# Patient Record
Sex: Female | Born: 1945
Health system: Southern US, Community
[De-identification: ages and names within clinical notes are randomized; demographics above are authoritative.]

## PROBLEM LIST (undated history)

## (undated) DIAGNOSIS — I10 Essential (primary) hypertension: Secondary | ICD-10-CM

## (undated) DIAGNOSIS — M1990 Unspecified osteoarthritis, unspecified site: Secondary | ICD-10-CM

## (undated) HISTORY — DX: Essential (primary) hypertension: I10

## (undated) HISTORY — DX: Unspecified osteoarthritis, unspecified site: M19.90

---

## 2011-03-25 HISTORY — PX: ABDOMINAL HYSTERECTOMY: SHX81

## 2012-03-24 HISTORY — PX: CATARACT EXTRACTION W/ INTRAOCULAR LENS  IMPLANT, BILATERAL: SHX1307

## 2012-03-24 HISTORY — PX: PARTIAL NEPHRECTOMY: SHX414

## 2015-10-04 ENCOUNTER — Other Ambulatory Visit: Payer: Self-pay | Admitting: Nurse Practitioner

## 2015-10-04 ENCOUNTER — Ambulatory Visit (INDEPENDENT_AMBULATORY_CARE_PROVIDER_SITE_OTHER): Payer: Medicare Other | Admitting: Nurse Practitioner

## 2015-10-04 ENCOUNTER — Encounter: Payer: Self-pay | Admitting: Nurse Practitioner

## 2015-10-04 ENCOUNTER — Telehealth: Payer: Self-pay

## 2015-10-04 VITALS — BP 144/88 | HR 59 | Temp 98.1°F | Ht 59.0 in | Wt 194.0 lb

## 2015-10-04 DIAGNOSIS — Z8542 Personal history of malignant neoplasm of other parts of uterus: Secondary | ICD-10-CM | POA: Diagnosis not present

## 2015-10-04 DIAGNOSIS — I10 Essential (primary) hypertension: Secondary | ICD-10-CM

## 2015-10-04 DIAGNOSIS — M170 Bilateral primary osteoarthritis of knee: Secondary | ICD-10-CM

## 2015-10-04 DIAGNOSIS — M810 Age-related osteoporosis without current pathological fracture: Secondary | ICD-10-CM | POA: Diagnosis not present

## 2015-10-04 LAB — CBC WITH DIFFERENTIAL/PLATELET
BASOS PCT: 0 %
Basophils Absolute: 0 cells/uL (ref 0–200)
EOS ABS: 174 {cells}/uL (ref 15–500)
Eosinophils Relative: 3 %
HEMATOCRIT: 37.7 % (ref 35.0–45.0)
Hemoglobin: 12.4 g/dL (ref 11.7–15.5)
LYMPHS PCT: 37 %
Lymphs Abs: 2146 cells/uL (ref 850–3900)
MCH: 30 pg (ref 27.0–33.0)
MCHC: 32.9 g/dL (ref 32.0–36.0)
MCV: 91.1 fL (ref 80.0–100.0)
MONO ABS: 522 {cells}/uL (ref 200–950)
MPV: 10.4 fL (ref 7.5–12.5)
Monocytes Relative: 9 %
NEUTROS ABS: 2958 {cells}/uL (ref 1500–7800)
Neutrophils Relative %: 51 %
Platelets: 243 10*3/uL (ref 140–400)
RBC: 4.14 MIL/uL (ref 3.80–5.10)
RDW: 13.7 % (ref 11.0–15.0)
WBC: 5.8 10*3/uL (ref 3.8–10.8)

## 2015-10-04 NOTE — Patient Instructions (Signed)
Will get blood work today Cont tylenol 500 mg 1-2 every 8 hours, max 3000 mg in 24 hours   Follow up for physical in 3 week

## 2015-10-04 NOTE — Telephone Encounter (Signed)
I called the patient's son to discuss the [employer] transportation form that he left for Shanda BumpsJessica to fill out. Shanda BumpsJessica said that she cannot complete this form until the patient has an MMSE. The patient is scheduled for a CPE on 10/29/15. The son agreed to wait until the 10/29/15 appointment to have the forms filled out.   The forms were placed in the filing cabinet in the folder for the 7th of the month. TL

## 2015-10-04 NOTE — Progress Notes (Signed)
Patient ID: Kerri Chan, female   DOB: [birth year], 70 y.o.   MRN: [id number]    PCP: [name], NP  Advanced Directive information Does patient have an advance directive?: No, Would patient like information on creating an advanced directive?: No - patient declined information  No Known Allergies  Chief Complaint  Patient presents with  . Medical Management of Chronic Issues    New Patient to get est with [facility], just moved here from [place]. Here with husband and son Kerri Chan      HPI: Patient is a 70 y.o. female seen in the office today to established care. Moved last year from [place] with son when he wsa relocated. Had visit with her PCP 1 year ago before they moved and has not seen someone since because they were busy getting settled in. Pt does not speak english, speaks hindi  Has not taken medication this morning- also fasting  OA of bilateral knees, has received cortisone injections in the past- has not had since 2015, taking OTC tylenol  Which helps her knee a little.  Stays in pain in her knees, attempts to ice and rest.  Also had back pain.  Hx of htn for over 15 years, son helping with diet  Was following oncology due to hx of uterine cancer in 2013 however in 2015 they completed follow up. Also found mass in kidney which was removed, not cancerous.   Review of Systems:  Review of Systems  Constitutional: Negative for activity change, appetite change, fatigue and unexpected weight change.  HENT: Negative for congestion and hearing loss.   Eyes: Negative.   Respiratory: Negative for cough and shortness of breath.   Cardiovascular: Negative for chest pain, palpitations and leg swelling.  Gastrointestinal: Negative for abdominal pain, diarrhea and constipation.  Genitourinary: Negative for dysuria and difficulty urinating.  Musculoskeletal: Positive for back pain and arthralgias. Negative for myalgias and joint swelling.  Skin: Negative for color change and wound.    Neurological: Negative for dizziness and weakness.  Psychiatric/Behavioral: Negative for behavioral problems, confusion and agitation.    Past Medical History  Diagnosis Date  . High blood pressure   . Arthritis    Past Surgical History  Procedure Laterality Date  . Abdominal hysterectomy  2013  . Cataract extraction w/ intraocular lens  implant, bilateral  2014  . Partial nephrectomy  2014    Right upper, mass   Social History:   reports that she has never smoked. She has never used smokeless tobacco. She reports that she does not drink alcohol or use illicit drugs.  History reviewed. No pertinent family history.  Medications: Patient's Medications  New Prescriptions   No medications on file  Previous Medications   ATENOLOL (TENORMIN) 25 MG TABLET    Take 25 mg by mouth daily. Take one tablet daily for blood pressure   HYDROCHLOROTHIAZIDE (MICROZIDE) 12.5 MG CAPSULE    Take 12.5 mg by mouth daily. Take one tablet daily for blood pressure   LOSARTAN (COZAAR) 100 MG TABLET    Take 100 mg by mouth daily. Take one tablet daily for blood pressure  Modified Medications   No medications on file  Discontinued Medications   No medications on file     Physical Exam:  Filed Vitals:   10/04/15 0915  BP: 144/88  Pulse: 59  Temp: 98.1 F (36.7 C)  TempSrc: Oral  Height:  (1.499 m)  Weight: 194 lb (87.998 kg)  SpO2: 94%   Body mass  index is 39.16 kg/(m^2).  Physical Exam  Constitutional: She is oriented to person, place, and time. She appears well-developed and well-nourished. No distress.  HENT:  Head: Normocephalic and atraumatic.  Eyes: Conjunctivae are normal. Pupils are equal, round, and reactive to light.  Neck: Normal range of motion. Neck supple.  Cardiovascular: Normal rate, regular rhythm and normal heart sounds.   Pulmonary/Chest: Effort normal and breath sounds normal.  Abdominal: Soft. Bowel sounds are normal.  Musculoskeletal: She exhibits  tenderness (to bilateral knees and lower back). She exhibits no edema.  Neurological: She is alert and oriented to person, place, and time.  Skin: Skin is warm and dry. She is not diaphoretic.  Psychiatric: She has a normal mood and affect.    Labs reviewed: Basic Metabolic Panel: No results for input(s): NA, K, CL, CO2, GLUCOSE, BUN, CREATININE, CALCIUM, MG, PHOS, TSH in the last 8760 hours. Liver Function Tests: No results for input(s): AST, ALT, ALKPHOS, BILITOT, PROT, ALBUMIN in the last 8760 hours. No results for input(s): LIPASE, AMYLASE in the last 8760 hours. No results for input(s): AMMONIA in the last 8760 hours. CBC: No results for input(s): WBC, NEUTROABS, HGB, HCT, MCV, PLT in the last 8760 hours. Lipid Panel: No results for input(s): CHOL, HDL, LDLCALC, TRIG, CHOLHDL, LDLDIRECT in the last 8760 hours. TSH: No results for input(s): TSH in the last 8760 hours. A1C: No results found for: HGBA1C   Assessment/Plan 1. Primary osteoarthritis of both knees Cont to use tylenol, ice and rest as needed -will set up for follow up with Dr Renato Gailseed in office for possible knee injections after physical.    2. Essential hypertension -well controlled on current regimen needs refills  - COMPLETE METABOLIC PANEL WITH GFR - Lipid panel - CBC with Differential/Platelets  3. Osteoporosis dexa scan early 2015 -not currently on supplements, encouraged calcium and vit D  -son to request these records to confirm.   4. History of uterine cancer Son will request release of records -will get referral to oncology for ongoing follow up.   Follow up in 3 weeks for EV with MMSE   Sharrell Krawiec K. Biagio BorgEubanks, AGNP  [facility] [phone](Monday-Friday 8 am - 5 pm) [phone] (after hours)

## 2015-10-05 LAB — COMPLETE METABOLIC PANEL WITH GFR
ALT: 10 U/L (ref 6–29)
AST: 15 U/L (ref 10–35)
Albumin: 4 g/dL (ref 3.6–5.1)
Alkaline Phosphatase: 49 U/L (ref 33–130)
BUN: 16 mg/dL (ref 7–25)
CHLORIDE: 103 mmol/L (ref 98–110)
CO2: 30 mmol/L (ref 20–31)
CREATININE: 0.82 mg/dL (ref 0.60–0.93)
Calcium: 9.4 mg/dL (ref 8.6–10.4)
GFR, EST AFRICAN AMERICAN: 84 mL/min (ref >=60)
GFR, Est Non African American: 73 mL/min (ref >=60)
GLUCOSE: 108 mg/dL — AB (ref 65–99)
Potassium: 3.9 mmol/L (ref 3.5–5.3)
SODIUM: 142 mmol/L (ref 135–146)
Total Bilirubin: 0.7 mg/dL (ref 0.2–1.2)
Total Protein: 6.8 g/dL (ref 6.1–8.1)

## 2015-10-08 LAB — LIPID PANEL
CHOL/HDL RATIO: 3 ratio (ref <=5.0)
Cholesterol: 200 mg/dL (ref 125–200)
HDL: 66 mg/dL (ref >=46)
LDL Cholesterol: 124 mg/dL (ref <130)
Triglycerides: 51 mg/dL (ref <150)
VLDL: 10 mg/dL (ref <30)

## 2015-10-22 ENCOUNTER — Other Ambulatory Visit: Payer: Self-pay | Admitting: *Deleted

## 2015-10-22 MED ORDER — HYDROCHLOROTHIAZIDE 12.5 MG PO CAPS: 12.5000 mg | ORAL_CAPSULE | Freq: Every day | ORAL | 1 refills | Status: DC

## 2015-10-22 MED ORDER — ATENOLOL 25 MG PO TABS: 25.0000 mg | ORAL_TABLET | Freq: Every day | ORAL | 1 refills | Status: DC

## 2015-10-22 MED ORDER — LOSARTAN POTASSIUM 100 MG PO TABS: 100.0000 mg | ORAL_TABLET | Freq: Every day | ORAL | 1 refills | Status: DC

## 2015-10-22 NOTE — Telephone Encounter (Signed)
Patient son requested. Faxed to pharmacy.  

## 2015-10-29 ENCOUNTER — Encounter: Payer: Self-pay | Admitting: Nurse Practitioner

## 2015-10-29 ENCOUNTER — Ambulatory Visit (INDEPENDENT_AMBULATORY_CARE_PROVIDER_SITE_OTHER): Payer: Medicare Other | Admitting: Nurse Practitioner

## 2015-10-29 VITALS — BP 128/84 | HR 65 | Temp 98.2°F | Resp 19 | Ht 59.0 in | Wt 196.0 lb

## 2015-10-29 DIAGNOSIS — E2839 Other primary ovarian failure: Secondary | ICD-10-CM | POA: Diagnosis not present

## 2015-10-29 DIAGNOSIS — Z0000 Encounter for general adult medical examination without abnormal findings: Secondary | ICD-10-CM

## 2015-10-29 DIAGNOSIS — Z1231 Encounter for screening mammogram for malignant neoplasm of breast: Secondary | ICD-10-CM | POA: Diagnosis not present

## 2015-10-29 DIAGNOSIS — Z1211 Encounter for screening for malignant neoplasm of colon: Secondary | ICD-10-CM | POA: Diagnosis not present

## 2015-10-29 DIAGNOSIS — M170 Bilateral primary osteoarthritis of knee: Secondary | ICD-10-CM | POA: Diagnosis not present

## 2015-10-29 DIAGNOSIS — I10 Essential (primary) hypertension: Secondary | ICD-10-CM

## 2015-10-29 DIAGNOSIS — R519 Headache, unspecified: Secondary | ICD-10-CM

## 2015-10-29 DIAGNOSIS — R51 Headache: Secondary | ICD-10-CM

## 2015-10-29 NOTE — Progress Notes (Signed)
Patient Care Team: Sharon SellerJessica K Keiyon Plack, NP as PCP - General (Geriatric Medicine)  Extended Emergency Contact Information Primary Emergency Contact: Kerri PeachAhmed,[name]  [place] Home Phone: [phone] Relation: Son No Known Allergies Code Status: FULL  Goals of Care: Advanced Directive information Advanced Directives 10/29/2015  Does patient have an advance directive? -  Would patient like information on creating an advanced directive? No - patient declined information     Chief Complaint  Patient presents with  . Medical Management of Chronic Issues    Complete physical. Compete [place] forms & Disability forms.   Kerri Chan. Other    Son, Kerri Chan, in room     HPI: Patient is a 70 y.o. female seen in today for an annual wellness exam.    Depression screen [facility] 2/9 10/29/2015 10/04/2015  Decreased Interest 0 0  Down, Depressed, Hopeless 0 0  PHQ - 2 Score 0 0    Fall Risk  10/29/2015 10/04/2015  Falls in the past year? No No   MMSE - Mini Mental State Exam 10/29/2015  Orientation to time 4  Orientation to Place 3  Registration 3  Attention/ Calculation 5  Recall 3  Language- name 2 objects 2  Language- repeat 1  Language- follow 3 step command 3  Language- read & follow direction 1  Write a sentence 1  Copy design 1  Total score 27     Health Maintenance  Topic Date Due  . Hepatitis C Screening  05/31/1945  . TETANUS/TDAP  03/24/1964  . MAMMOGRAM  03/25/1995  . ZOSTAVAX  03/24/2005  . DEXA SCAN  03/24/2010  . PNA vac Low Risk Adult (2 of 2 - PCV13) 12/23/2014  . INFLUENZA VACCINE  10/23/2015  . COLONOSCOPY  11/20/2023 (Originally 03/25/1995)  need records from previous provider- they have been requested Unsure of exact vaccine history  Urinary incontinence? none Functional Status Survey: Is the patient deaf or have difficulty hearing?: No Does the patient have difficulty seeing, even when wearing glasses/contacts?: No Does the patient have difficulty  concentrating, remembering, or making decisions?: No Does the patient have difficulty walking or climbing stairs?: Yes (knee pain and has support with walker) Does the patient have difficulty dressing or bathing?: No Does the patient have difficulty doing errands alone such as visiting a doctor's office or shopping?: Yes (language barrier) Exercise? Unable due to knee pain Diet? Heart healthy diet  No exam data present Dentition: does not go to the dentist routinely, fair condition  Pain: knee pain, tylenol is not effective.  Has had headaches for the last 3 weeks, sometimes on right some times on left- central headache Can happen multiple times a day, comes and goes, 3-4/10 No hx of headaches, does not effect vision, no dizziness  Light or sounds do no effect it.  No increase in stress in the last few weeks No changes in vision   Past Medical History:  Diagnosis Date  . Arthritis   . High blood pressure     Past Surgical History:  Procedure Laterality Date  . ABDOMINAL HYSTERECTOMY  2013   due to cancer in uterus   . CATARACT EXTRACTION W/ INTRAOCULAR LENS  IMPLANT, BILATERAL  2014  . PARTIAL NEPHRECTOMY  2014   Right upper, mass    Social History   Social History  . Marital status: Married    Spouse name: N/A  . Number of children: N/A  . Years of education: N/A   Social History Main Topics  .  Smoking status: Never Smoker  . Smokeless tobacco: Never Used  . Alcohol use No  . Drug use: No  . Sexual activity: Not Asked   Other Topics Concern  . None   Social History Narrative   Diet? Healthy      Do you drink/eat things with caffeine? Black and Green Tea      Marital status?                   m                 What year were you married? 1968      Do you live in a house, apartment, assisted living, condo, trailer, etc.? apartment      Is it one or more stories? 2 story      How many persons live in your home? 3 (husband and son)      Do you have any  pets in your home? (please list) 1- a parrot (small bird)      Current or past profession: -      Do you exercise?      walk                                Type & how often? light      Do you have a living will? no      Do you have a DNR form?      no                            If not, do you want to discuss one? N/A      Do you have signed POA/HPOA for forms? N/A          Family History  Problem Relation Age of Onset  . Arthritis Mother   . Arthritis Father     Review of Systems:  Review of Systems  Constitutional: Negative for activity change, appetite change, fatigue and unexpected weight change.  HENT: Negative for congestion and hearing loss.   Eyes: Negative.   Respiratory: Negative for cough and shortness of breath.   Cardiovascular: Negative for chest pain, palpitations and leg swelling.  Gastrointestinal: Negative for abdominal pain, constipation and diarrhea.  Genitourinary: Negative for difficulty urinating and dysuria.  Musculoskeletal: Positive for arthralgias and back pain. Negative for joint swelling and myalgias.  Skin: Negative for color change and wound.  Neurological: Positive for headaches. Negative for dizziness and weakness.  Psychiatric/Behavioral: Negative for agitation, behavioral problems and confusion.       Medication List       Accurate as of 10/29/15 11:23 AM. Always use your most recent med list.          atenolol 25 MG tablet Commonly known as:  TENORMIN Take 1 tablet (25 mg total) by mouth daily. for blood pressure   hydrochlorothiazide 12.5 MG capsule Commonly known as:  MICROZIDE Take 1 capsule (12.5 mg total) by mouth daily. for blood pressure   losartan 100 MG tablet Commonly known as:  COZAAR Take 1 tablet (100 mg total) by mouth daily. for blood pressure         Physical Exam: Vitals:   10/29/15 1104  BP: 128/84  Pulse: 65  Resp: 19  Temp: 98.2 F (36.8 C)  TempSrc: Oral  SpO2: 92%  Weight: 196 lb (88.9 kg)  Height: 4\' 11"  (1.499 m)   Body mass index is 39.59 kg/m. Physical Exam  Constitutional: She is oriented to person, place, and time. She appears well-developed and well-nourished. No distress.  HENT:  Head: Normocephalic and atraumatic.  Right Ear: External ear normal.  Left Ear: External ear normal.  Nose: Nose normal.  Mouth/Throat: Oropharynx is clear and moist. No oropharyngeal exudate.  Eyes: Conjunctivae are normal. Pupils are equal, round, and reactive to light.  Neck: Normal range of motion. Neck supple.  Cardiovascular: Normal rate, regular rhythm, normal heart sounds and intact distal pulses.   Pulmonary/Chest: Effort normal and breath sounds normal.  Declines breast exam today  Abdominal: Soft. Bowel sounds are normal. She exhibits no distension. There is no tenderness.  Obese abdomen   Musculoskeletal: She exhibits tenderness (to bilateral knees and lower back). She exhibits no edema.  Neurological: She is alert and oriented to person, place, and time. She has normal reflexes.  Skin: Skin is warm and dry. She is not diaphoretic.  Psychiatric: She has a normal mood and affect.    Labs reviewed: Basic Metabolic Panel:  Recent Labs  29/56/21 0952  NA 142  K 3.9  CL 103  CO2 30  GLUCOSE 108*  BUN 16  CREATININE 0.82  CALCIUM 9.4   Liver Function Tests:  Recent Labs  10/04/15 0952  AST 15  ALT 10  ALKPHOS 49  BILITOT 0.7  PROT 6.8  ALBUMIN 4.0   No results for input(s): LIPASE, AMYLASE in the last 8760 hours. No results for input(s): AMMONIA in the last 8760 hours. CBC:  Recent Labs  10/04/15 0952  WBC 5.8  NEUTROABS 2,958  HGB 12.4  HCT 37.7  MCV 91.1  PLT 243   Lipid Panel:  Recent Labs  10/04/15 0952  CHOL 200  HDL 66  LDLCALC 124  TRIG 51  CHOLHDL 3.0   No results found for: HGBA1C  Procedures: No results found.  Assessment/Plan 1. Special screening for malignant neoplasms, colon Agreeable to cologuard  2. Encounter  for screening mammogram for breast cancer -declines breast exam today but agreeable to mammogram  - MM DIGITAL SCREENING BILATERAL; Future  3. Essential hypertension Blood pressure stable on current regimen, cont current medication and lifestyle modifications  - EKG 12-Lead  4. Estrogen deficiency - screening DG Bone Density; Future  5. Primary osteoarthritis of both knees - DG Knee Complete 4 Views Left; Future - DG Knee Complete 4 Views Right; Future -to cont tylenol as needed, will have pt follow up with Dr Renato Gails for injections once xrays complete   6. Headache, unspecified headache type -new onset headache, no neurological deficits on exam today - CT Head Wo Contrast for further evaluation   7.  Medicare annual wellness visit, subsequent MMSE 27/30, lives with son who helps take care of her and her husband Language barrier noted  Counseling regarding regular self-examination of the breasts on a monthly basis, prevention of dental and periodontal disease, diet, regular sustained exercise for at least 30 minutes 5 times per week, routine screening interval for mammogram as recommended by the American Cancer Society and ACOG, importance of regular PAP smears, smoking cessation, tobacco use,  and recommended schedule for GI hemoccult testing, colonoscopy, cholesterol, thyroid and diabetes screening. -hx of breast cancer, pt referred to oncologist for ongoing follow up  To follow up with Dr Renato Gails for knee injections Forms completed   Kerri Chan. Kerri Chan  [facility] [phone] 8 am - 5  pm) [phone] (after hours)

## 2015-10-29 NOTE — Patient Instructions (Addendum)
To get xrays of knees prior to appt with Dr Renato Gailseed  To get CT head due to headaches  Diet modifications- decrease sugars and simple carbohydrates Weight loss encouraged will help knee pain   Follow up with Dr Renato Gailseed for knee injections   Need to get mammogram and bone density scan

## 2015-11-02 ENCOUNTER — Telehealth: Payer: Self-pay | Admitting: *Deleted

## 2015-11-02 NOTE — Telephone Encounter (Signed)
Referring office called to obtain most recent records. Called LM at [facility] at [phone]  [facility]

## 2015-11-06 ENCOUNTER — Other Ambulatory Visit: Payer: Self-pay | Admitting: Nurse Practitioner

## 2015-11-06 DIAGNOSIS — Z1231 Encounter for screening mammogram for malignant neoplasm of breast: Secondary | ICD-10-CM

## 2015-11-07 ENCOUNTER — Ambulatory Visit
Admission: RE | Admit: 2015-11-07 | Discharge: 2015-11-07 | Disposition: A | Discharge status: Home / Self Care | Payer: Medicare Other | Source: Ambulatory Visit | Attending: Nurse Practitioner | Admitting: Nurse Practitioner

## 2015-11-07 DIAGNOSIS — R51 Headache: Secondary | ICD-10-CM | POA: Diagnosis not present

## 2015-11-07 DIAGNOSIS — M170 Bilateral primary osteoarthritis of knee: Secondary | ICD-10-CM

## 2015-11-07 DIAGNOSIS — R519 Headache, unspecified: Secondary | ICD-10-CM

## 2015-11-07 DIAGNOSIS — M179 Osteoarthritis of knee, unspecified: Secondary | ICD-10-CM | POA: Diagnosis not present

## 2015-11-09 ENCOUNTER — Encounter: Payer: Self-pay | Admitting: Nurse Practitioner

## 2015-11-15 ENCOUNTER — Ambulatory Visit
Admission: RE | Admit: 2015-11-15 | Discharge: 2015-11-15 | Disposition: A | Discharge status: Home / Self Care | Payer: Medicare Other | Source: Ambulatory Visit | Attending: Nurse Practitioner | Admitting: Nurse Practitioner

## 2015-11-15 DIAGNOSIS — Z780 Asymptomatic menopausal state: Secondary | ICD-10-CM | POA: Diagnosis not present

## 2015-11-15 DIAGNOSIS — Z1231 Encounter for screening mammogram for malignant neoplasm of breast: Secondary | ICD-10-CM

## 2015-11-15 DIAGNOSIS — M8588 Other specified disorders of bone density and structure, other site: Secondary | ICD-10-CM | POA: Diagnosis not present

## 2015-11-15 DIAGNOSIS — E2839 Other primary ovarian failure: Secondary | ICD-10-CM

## 2015-11-28 ENCOUNTER — Ambulatory Visit: Payer: Self-pay | Attending: Gynecologic Oncology | Admitting: Gynecologic Oncology

## 2015-12-03 ENCOUNTER — Ambulatory Visit: Payer: Medicare Other | Admitting: Nurse Practitioner

## 2015-12-10 ENCOUNTER — Ambulatory Visit (INDEPENDENT_AMBULATORY_CARE_PROVIDER_SITE_OTHER): Payer: Medicare Other | Admitting: Internal Medicine

## 2015-12-10 ENCOUNTER — Encounter: Payer: Self-pay | Admitting: Internal Medicine

## 2015-12-10 VITALS — BP 160/100 | HR 69 | Temp 98.1°F | Wt 199.0 lb

## 2015-12-10 DIAGNOSIS — I10 Essential (primary) hypertension: Secondary | ICD-10-CM | POA: Diagnosis not present

## 2015-12-10 DIAGNOSIS — M170 Bilateral primary osteoarthritis of knee: Secondary | ICD-10-CM | POA: Diagnosis not present

## 2015-12-10 MED ORDER — METHYLPREDNISOLONE ACETATE 40 MG/ML IJ SUSP
40.0000 mg | Freq: Once | INTRAMUSCULAR | Status: AC
  Administered 2015-12-10: 40 mg via INTRAMUSCULAR

## 2015-12-10 NOTE — Addendum Note (Signed)
Addended by: Sueanne MargaritaSMITH, DESHANNON L on: 12/10/2015 10:58 AM   Modules accepted: Orders

## 2015-12-10 NOTE — Progress Notes (Signed)
[facility] Provider:  Dillan Lunden L. Renato Gailseed, D.O., C.M.D.  Code Status: full code  Goals of Care:  Advanced Directives 12/10/2015  Does patient have an advance directive? No  Would patient like information on creating an advanced directive? No - patient declined information    Chief Complaint  Patient presents with  . Follow-up    follow-up after knee xray, possible knee injection    HPI: Patient is a 70 y.o. female seen today for knee pain and need for bilateral steroid injections.  Conservative therapies with otc medications have not been successful at providing relief.  She ambulates with a walker.  Xrays of her right knee showed Moderate degenerative changes, especially medially with joint space loss and spurring, also intra-articular loose body in suprapatellar bursa.    Xrays of left knee showed moderate degenerative changes also, joint space narrowing and spurring more in the medial and patellofemoral compartments.    Consent obtained for knee injections.  Patient is agreeable and has actually had steroid injections in [place] before.  The interpreter assisted with preparing her for the injections and reviewing the results of the xrays.  Pt takes bp meds at 10 am so had not had them at the time of her appt.  BP was very high b/c of that and severe knee pain where she was tender to touch on both knees.  Past Medical History:  Diagnosis Date  . Arthritis   . High blood pressure     Past Surgical History:  Procedure Laterality Date  . ABDOMINAL HYSTERECTOMY  2013   due to cancer in uterus   . CATARACT EXTRACTION W/ INTRAOCULAR LENS  IMPLANT, BILATERAL  2014  . PARTIAL NEPHRECTOMY  2014   Right upper, mass    No Known Allergies    Medication List       Accurate as of 12/10/15  9:40 AM. Always use your most recent med list.          atenolol 25 MG tablet Commonly known as:  TENORMIN Take 1 tablet (25 mg total) by mouth daily. for blood pressure     CALTRATE 600+D 600-400 MG-UNIT tablet Generic drug:  Calcium Carbonate-Vitamin D Take 1 tablet by mouth 2 (two) times daily.   hydrochlorothiazide 12.5 MG capsule Commonly known as:  MICROZIDE Take 1 capsule (12.5 mg total) by mouth daily. for blood pressure   losartan 100 MG tablet Commonly known as:  COZAAR Take 1 tablet (100 mg total) by mouth daily. for blood pressure       Review of Systems:  Review of Systems  Constitutional: Negative for chills and fever.  Cardiovascular:       Hypertension  Musculoskeletal: Positive for joint pain.       Gait problem, uses walker    Health Maintenance  Topic Date Due  . Hepatitis C Screening  [birth year]  . TETANUS/TDAP  03/24/1964  . ZOSTAVAX  03/24/2005  . PNA vac Low Risk Adult (2 of 2 - PCV13) 12/23/2014  . INFLUENZA VACCINE  10/23/2015  . COLONOSCOPY  11/20/2023 (Originally 03/25/1995)  . MAMMOGRAM  11/14/2017  . DEXA SCAN  Completed    Physical Exam: Vitals:   12/10/15 0921  BP: (!) 160/100  Pulse: 69  Temp: 98.1 F (36.7 C)  TempSrc: Oral  SpO2: 96%  Weight: 199 lb (90.3 kg)   Body mass index is 40.19 kg/m. Physical Exam  Constitutional: She appears well-developed and well-nourished. No distress.  Musculoskeletal: She exhibits tenderness.  Small effusions medially bilateral, quite tender to touch bilaterally  Neurological: She is alert.  Skin: Skin is warm and dry.    Labs reviewed: Basic Metabolic Panel:  Recent Labs  16/10/96 0952  NA 142  K 3.9  CL 103  CO2 30  GLUCOSE 108*  BUN 16  CREATININE 0.82  CALCIUM 9.4   Liver Function Tests:  Recent Labs  10/04/15 0952  AST 15  ALT 10  ALKPHOS 49  BILITOT 0.7  PROT 6.8  ALBUMIN 4.0   No results for input(s): LIPASE, AMYLASE in the last 8760 hours. No results for input(s): AMMONIA in the last 8760 hours. CBC:  Recent Labs  10/04/15 0952  WBC 5.8  NEUTROABS 2,958  HGB 12.4  HCT 37.7  MCV 91.1  PLT 243   Lipid Panel:  Recent  Labs  10/04/15 0952  CHOL 200  HDL 66  LDLCALC 124  TRIG 51  CHOLHDL 3.0   No results found for: HGBA1C  Procedures since last visit: Dg Bone Density  Result Date: 11/15/2015 EXAM: DUAL X-RAY ABSORPTIOMETRY (DXA) FOR BONE MINERAL DENSITY IMPRESSION: Referring Physician:  [name] PATIENT: Name: Kerri Chan, Kerri Chan Patient ID: [id number] Birth Date: [birth year] Height: 59.0 in. Sex: Female Measured: 11/15/2015 Weight: 196.0 lbs. Indications: Advanced Age, Bilateral Ovariectomy (65.51), Estrogen Deficient, Hysterectomy, Postmenopausal, Rheumatoid Arthritis (714.0) Fractures: None Treatments: Calcium (E943.0) ASSESSMENT: The BMD measured at AP Spine L1-L4 is 1.022 g/cm2 with a T-score of -1.4. This patient is considered osteopenic according to World Health Organization [facility]) criteria. Site Region Measured Date Measured Age YA BMD Significant CHANGE T-score AP Spine  L1-L4      11/15/2015    70.6         -1.4    1.022 g/cm2 DualFemur Neck Right 11/15/2015    70.6         -0.8    0.928 g/cm2 World Health Organization [facility]) criteria for post-menopausal, Caucasian Women: Normal       T-score at or above -1 SD Osteopenia   T-score between -1 and -2.5 SD Osteoporosis T-score at or below -2.5 SD RECOMMENDATION: National Osteoporosis Foundation recommends that FDA-approved medical therapies be considered in postmenopausal women and men age 62 or older with a: 1. Hip or vertebral (clinical or morphometric) fracture. 2. T-score of <-2.5 at the spine or hip. 3. Ten-year fracture probability by FRAX of 3% or greater for hip fracture or 20% or greater for major osteoporotic fracture. All treatment decisions require clinical judgment and consideration of individual patient factors, including patient preferences, co-morbidities, previous drug use, risk factors not captured in the FRAX model (e.g. falls, vitamin D deficiency, increased bone turnover, interval significant decline in bone density) and possible under - or  over-estimation of fracture risk by FRAX. All patients should ensure an adequate intake of dietary calcium (1200 mg/d) and vitamin D (800 IU daily) unless contraindicated. FOLLOW-UP: People with diagnosed cases of osteoporosis or at high risk for fracture should have regular bone mineral density tests. For patients eligible for Medicare, routine testing is allowed once every 2 years. The testing frequency can be increased to one year for patients who have rapidly progressing disease, those who are receiving or discontinuing medical therapy to restore bone mass, or have additional risk factors. FRAX* 10-year Probability of Fracture Based on femoral neck BMD: DualFemur (Right) Major Osteoporotic Fracture: 9.7% Hip Fracture:                0.9% Population:  [place] (Caucasian) Risk Factors:                Rheumatoid Arthritis (714.0) *FRAX is a Armed forces logistics/support/administrative officer of the [employer] of [employer] for Metabolic Bone Disease, a World Science writer (WHO) [employer]. ASSESSMENT: The probability of a major osteoporotic fracture is 9.7 % within the next ten years. The probability of a hip fracture is 0.9 % within the next ten years. Electronically Signed   By: Myles Rosenthal M.D.   On: 11/15/2015 17:11   Mm Digital Screening Bilateral  Result Date: 12/06/2015 CLINICAL DATA:  Screening. EXAM: DIGITAL SCREENING BILATERAL MAMMOGRAM WITH CAD COMPARISON:  None. ACR Breast Density Category b: There are scattered areas of fibroglandular density. FINDINGS: There are no findings suspicious for malignancy. Images were processed with CAD. IMPRESSION: No mammographic evidence of malignancy. A result letter of this screening mammogram will be mailed directly to the patient. RECOMMENDATION: Screening mammogram in one year. (Code:SM-B-01Y) BI-RADS CATEGORY  1: Negative. Electronically Signed   By: Britta Mccreedy M.D.   On: 12/06/2015 16:15    Assessment/Plan 1. Primary osteoarthritis of both  knees -was not responding to otc pain medications and not tolerant of stronger meds -did well with injections in the past in [place] -consent obtained, bilateral knees cleansed with betadine, sprayed with topical anesthetic and via medial approach, 2cc of 1% lidocaine and 1cc of 40mg  depomedrol injected into each knee  2. Benign Essential htn -bp elevated this am but pt had not had her medicatons for the morning (takes at 10am regularly) -cont hctz, cozaar and atenolol and monitor  Next appt:  3 mos for med mgt with Abbey Chatters, NP  Daysia Vandenboom L. Michol Emory, D.O. Geriatrics [facility] [address], [place] [address] Cell Phone (Mon-Fri 8am-5pm):  [phone] On Call:  [phone] & follow prompts after 5pm & weekends Office Phone:  [phone] Office Fax:  [phone]

## 2015-12-11 ENCOUNTER — Telehealth: Payer: Self-pay | Admitting: *Deleted

## 2015-12-11 NOTE — Telephone Encounter (Signed)
Patient son called and stated that his mother received a Knee injection yesterday and last night and today he feels like her blood pressure is running high. He stated that her face is red and puffy. Seems to be doing better now than at lunch time. He wanted to make you aware. He stated that he will monitor her and call us with report.

## 2015-12-12 NOTE — Telephone Encounter (Signed)
LMOM to return call.

## 2015-12-12 NOTE — Telephone Encounter (Signed)
It is possible that the steroid medication could make her blood pressure go up and it can cause some swelling as a general side effect.  If her blood pressures are remaining up over 150/90 for 3+ days, she should come back in to see Kerri Chan or myself.

## 2016-03-10 ENCOUNTER — Encounter: Payer: Self-pay | Admitting: Nurse Practitioner

## 2016-03-10 ENCOUNTER — Ambulatory Visit (INDEPENDENT_AMBULATORY_CARE_PROVIDER_SITE_OTHER): Payer: Medicare Other | Admitting: Nurse Practitioner

## 2016-03-10 VITALS — BP 154/96 | HR 63 | Temp 97.8°F | Resp 17 | Ht 59.0 in | Wt 199.6 lb

## 2016-03-10 DIAGNOSIS — I10 Essential (primary) hypertension: Secondary | ICD-10-CM | POA: Diagnosis not present

## 2016-03-10 DIAGNOSIS — Z23 Encounter for immunization: Secondary | ICD-10-CM | POA: Diagnosis not present

## 2016-03-10 DIAGNOSIS — M170 Bilateral primary osteoarthritis of knee: Secondary | ICD-10-CM | POA: Diagnosis not present

## 2016-03-10 DIAGNOSIS — M8580 Other specified disorders of bone density and structure, unspecified site: Secondary | ICD-10-CM | POA: Diagnosis not present

## 2016-03-10 DIAGNOSIS — K219 Gastro-esophageal reflux disease without esophagitis: Secondary | ICD-10-CM

## 2016-03-10 NOTE — Progress Notes (Signed)
Careteam: Patient Care Team: Lauree Chandler, NP as PCP - General (Geriatric Medicine)  Advanced Directive information Does Patient Have a Medical Advance Directive?: No, Would patient like information on creating a medical advance directive?: Yes (MAU/Ambulatory/Procedural Areas - Information given) (Paperwork was given to son to complete. )  No Known Allergies  Chief Complaint  Patient presents with  . Medical Management of Chronic Issues    3 month follow up. Pt reports pain/swelling both knees.      HPI: Patient is a 70 y.o. female seen in the office today for routine follow up.  Reports pain in her knees. Had injection 3 months ago and reports these helped a lot but have worn off.  Pain is currently manageable at this time. However does report pain to knees, shin and thigh. Pain worse on right vs left Using pedal that has a cycling aspect to it for exercise. Walks with son at store.  Blood pressure was high after injection but after 24 hours returned to normal.  Blood pressure at home 115-120/80. Son checks frequently.  Eating well. No constipation  occasional GERD but uses TUMs with good effects  Review of Systems:  Review of Systems  Constitutional: Negative for activity change, appetite change, fatigue and unexpected weight change.  HENT: Negative for congestion and hearing loss.   Eyes: Negative.   Respiratory: Negative for cough and shortness of breath.   Cardiovascular: Negative for chest pain, palpitations and leg swelling.  Gastrointestinal: Negative for abdominal pain, constipation and diarrhea.  Genitourinary: Negative for difficulty urinating and dysuria.  Musculoskeletal: Positive for arthralgias (in knees) and back pain. Negative for joint swelling and myalgias.  Skin: Negative for color change and wound.  Neurological: Negative for dizziness and weakness.  Psychiatric/Behavioral: Negative for agitation, behavioral problems and confusion.    Past  Medical History:  Diagnosis Date  . Arthritis   . High blood pressure    Past Surgical History:  Procedure Laterality Date  . ABDOMINAL HYSTERECTOMY  2013   due to cancer in uterus   . CATARACT EXTRACTION W/ INTRAOCULAR LENS  IMPLANT, BILATERAL  2014  . PARTIAL NEPHRECTOMY  2014   Right upper, mass   Social History:   reports that she has never smoked. She has never used smokeless tobacco. She reports that she does not drink alcohol or use drugs.  Family History  Problem Relation Age of Onset  . Arthritis Mother   . Arthritis Father     Medications: Patient's Medications  New Prescriptions   No medications on file  Previous Medications   ATENOLOL (TENORMIN) 25 MG TABLET    Take 1 tablet (25 mg total) by mouth daily. for blood pressure   CALCIUM CARBONATE-VITAMIN D (CALTRATE 600+D) 600-400 MG-UNIT TABLET    Take 1 tablet by mouth 2 (two) times daily.   HYDROCHLOROTHIAZIDE (MICROZIDE) 12.5 MG CAPSULE    Take 1 capsule (12.5 mg total) by mouth daily. for blood pressure   LOSARTAN (COZAAR) 100 MG TABLET    Take 1 tablet (100 mg total) by mouth daily. for blood pressure  Modified Medications   No medications on file  Discontinued Medications   No medications on file     Physical Exam:  Vitals:   03/10/16 1302  BP: (!) 154/96  Pulse: 63  Resp: 17  Temp: 97.8 F (36.6 C)  TempSrc: Oral  SpO2: 95%  Weight: 199 lb 9.6 oz (90.5 kg)  Height: _0  (1.499 m)   Body  mass index is 40.31 kg/m.  Physical Exam  Constitutional: She is oriented to person, place, and time. She appears well-developed and well-nourished. No distress.  HENT:  Head: Normocephalic and atraumatic.  Right Ear: External ear normal.  Left Ear: External ear normal.  Nose: Nose normal.  Mouth/Throat: Oropharynx is clear and moist. No oropharyngeal exudate.  Eyes: Conjunctivae are normal. Pupils are equal, round, and reactive to light.  Neck: Normal range of motion. Neck supple.  Cardiovascular:  Normal rate, regular rhythm, normal heart sounds and intact distal pulses.   Pulmonary/Chest: Effort normal and breath sounds normal.  Abdominal: Soft. Bowel sounds are normal. She exhibits no distension. There is no tenderness.  Obese abdomen   Musculoskeletal: She exhibits tenderness (to bilateral knees and lower back). She exhibits no edema.  Neurological: She is alert and oriented to person, place, and time. She has normal reflexes.  Skin: Skin is warm and dry. She is not diaphoretic.  Psychiatric: She has a normal mood and affect.    Labs reviewed: Basic Metabolic Panel:  Recent Labs  10/04/15 0952  NA 142  K 3.9  CL 103  CO2 30  GLUCOSE 108*  BUN 16  CREATININE 0.82  CALCIUM 9.4   Liver Function Tests:  Recent Labs  10/04/15 0952  AST 15  ALT 10  ALKPHOS 49  BILITOT 0.7  PROT 6.8  ALBUMIN 4.0   No results for input(s): LIPASE, AMYLASE in the last 8760 hours. No results for input(s): AMMONIA in the last 8760 hours. CBC:  Recent Labs  10/04/15 0952  WBC 5.8  NEUTROABS 2,958  HGB 12.4  HCT 37.7  MCV 91.1  PLT 243   Lipid Panel:  Recent Labs  10/04/15 0952  CHOL 200  HDL 66  LDLCALC 124  TRIG 51  CHOLHDL 3.0   TSH: No results for input(s): TSH in the last 8760 hours. A1C: No results found for: HGBA1C   Assessment/Plan  1. Primary osteoarthritis of both knees Improved after injection. May use tylenol and muscle rubs as needed -may make appt with Dr Mariea Clonts for another injection after 4 months if needed  2. Essential hypertension -elevated today but well controlled at home. May be related to stress of OV/nerveness  -to cont current regimen - BMP with eGFR  3. Osteopenia, unspecified location -cont weight bearing exercises and Calcium and Vit D supplement  4. Gastroesophageal reflux disease without esophagitis Occasional GERD, cont diet modifications and TUMS as needed  Riley Hallum K. Harle Battiest  [facility] [phone] 8 am - 5 pm) [phone] (after hours)

## 2016-03-10 NOTE — Patient Instructions (Signed)
To use tylenol 325 1-2 tablets every 6 hours as needed for pain.  May use muscle rubs as needed (biofreeze, bengay, icyhot)  You can have your knees injected every 4 months with Dr Renato Gailseed If needed for knee pain-- call and make appt with her as needed for this.

## 2016-03-11 LAB — BASIC METABOLIC PANEL WITH GFR
BUN: 15 mg/dL (ref 7–25)
CHLORIDE: 98 mmol/L (ref 98–110)
CO2: 29 mmol/L (ref 20–31)
Calcium: 9.8 mg/dL (ref 8.6–10.4)
Creat: 0.79 mg/dL (ref 0.60–0.93)
GFR, EST AFRICAN AMERICAN: 88 mL/min (ref >=60)
GFR, EST NON AFRICAN AMERICAN: 76 mL/min (ref >=60)
Glucose, Bld: 124 mg/dL — ABNORMAL HIGH (ref 65–99)
POTASSIUM: 3.8 mmol/L (ref 3.5–5.3)
Sodium: 137 mmol/L (ref 135–146)

## 2016-04-10 ENCOUNTER — Other Ambulatory Visit: Payer: Self-pay | Admitting: Nurse Practitioner

## 2016-09-08 ENCOUNTER — Ambulatory Visit: Payer: Medicare Other | Admitting: Nurse Practitioner

## 2016-10-04 ENCOUNTER — Other Ambulatory Visit: Payer: Self-pay | Admitting: Nurse Practitioner

## 2016-10-05 ENCOUNTER — Other Ambulatory Visit: Payer: Self-pay | Admitting: Nurse Practitioner

## 2016-10-30 ENCOUNTER — Telehealth: Payer: Self-pay

## 2016-10-30 NOTE — Telephone Encounter (Signed)
Okay, noted

## 2016-10-30 NOTE — Telephone Encounter (Signed)
A fax was received from [facility] stating that patient's cologuard order has been cancelled due to order expired. A new order form must be submitted if test is clinically indicated.   I called patient to see if she was still interested in having cologuard done but there was no answer. I left a message asking that patient call the office to discuss desire to continue with cologuard.

## 2017-01-06 ENCOUNTER — Telehealth: Payer: Self-pay

## 2017-01-06 NOTE — Telephone Encounter (Signed)
Patient missed her last scheduled appointment in June, so I was calling to see about getting her scheduled for an appointment. I left a message on voicemail of patient's son asking that patient schedule an appointment

## 2017-03-22 ENCOUNTER — Other Ambulatory Visit: Payer: Self-pay | Admitting: Nurse Practitioner

## 2017-03-23 NOTE — Telephone Encounter (Signed)
Last OV 03/10/16  No Showed 09/08/16  No pending appointment  Message was left on 01/06/17 informing patient she is due for an appointment.  Left message on voicemail for patient to return call when available, reason for call:  Inform patient that it is mandatory to have an appointment to continue with medication refills.

## 2017-03-31 ENCOUNTER — Other Ambulatory Visit: Payer: Self-pay | Admitting: Nurse Practitioner

## 2017-04-01 NOTE — Telephone Encounter (Signed)
Left message on voicemail for patient to return call when available   Patient must schedule and keep appointment to receive refills. Per PCP no refills until appointment scheduled

## 2017-04-02 ENCOUNTER — Ambulatory Visit (INDEPENDENT_AMBULATORY_CARE_PROVIDER_SITE_OTHER): Payer: Medicare Other | Admitting: Nurse Practitioner

## 2017-04-02 ENCOUNTER — Encounter: Payer: Self-pay | Admitting: Nurse Practitioner

## 2017-04-02 VITALS — BP 158/96 | HR 61 | Temp 97.8°F | Ht 59.0 in | Wt 197.0 lb

## 2017-04-02 DIAGNOSIS — Z1211 Encounter for screening for malignant neoplasm of colon: Secondary | ICD-10-CM | POA: Diagnosis not present

## 2017-04-02 DIAGNOSIS — I10 Essential (primary) hypertension: Secondary | ICD-10-CM | POA: Diagnosis not present

## 2017-04-02 DIAGNOSIS — Z23 Encounter for immunization: Secondary | ICD-10-CM

## 2017-04-02 DIAGNOSIS — E782 Mixed hyperlipidemia: Secondary | ICD-10-CM

## 2017-04-02 DIAGNOSIS — M8580 Other specified disorders of bone density and structure, unspecified site: Secondary | ICD-10-CM | POA: Insufficient documentation

## 2017-04-02 DIAGNOSIS — M170 Bilateral primary osteoarthritis of knee: Secondary | ICD-10-CM

## 2017-04-02 LAB — COMPLETE METABOLIC PANEL WITH GFR
AG Ratio: 1.4 (calc) (ref 1.0–2.5)
ALBUMIN MSPROF: 4.1 g/dL (ref 3.6–5.1)
ALT: 12 U/L (ref 6–29)
AST: 16 U/L (ref 10–35)
Alkaline phosphatase (APISO): 53 U/L (ref 33–130)
BILIRUBIN TOTAL: 0.7 mg/dL (ref 0.2–1.2)
BUN: 12 mg/dL (ref 7–25)
CHLORIDE: 99 mmol/L (ref 98–110)
CO2: 34 mmol/L — AB (ref 20–32)
CREATININE: 0.8 mg/dL (ref 0.60–0.93)
Calcium: 10 mg/dL (ref 8.6–10.4)
GFR, Est African American: 85 mL/min/{1.73_m2} (ref >=60)
GFR, Est Non African American: 74 mL/min/{1.73_m2} (ref >=60)
GLUCOSE: 117 mg/dL — AB (ref 65–99)
Globulin: 3 g/dL (calc) (ref 1.9–3.7)
Potassium: 4.5 mmol/L (ref 3.5–5.3)
Sodium: 139 mmol/L (ref 135–146)
Total Protein: 7.1 g/dL (ref 6.1–8.1)

## 2017-04-02 LAB — LIPID PANEL
CHOL/HDL RATIO: 3.7 (calc) (ref <5.0)
Cholesterol: 221 mg/dL — ABNORMAL HIGH (ref <200)
HDL: 60 mg/dL (ref >50)
LDL CHOLESTEROL (CALC): 144 mg/dL — AB
NON-HDL CHOLESTEROL (CALC): 161 mg/dL — AB (ref <130)
TRIGLYCERIDES: 73 mg/dL (ref <150)

## 2017-04-02 LAB — CBC WITH DIFFERENTIAL/PLATELET
BASOS PCT: 0.8 %
Basophils Absolute: 42 cells/uL (ref 0–200)
EOS PCT: 3 %
Eosinophils Absolute: 159 cells/uL (ref 15–500)
HEMATOCRIT: 39 % (ref 35.0–45.0)
Hemoglobin: 13 g/dL (ref 11.7–15.5)
LYMPHS ABS: 2062 {cells}/uL (ref 850–3900)
MCH: 30 pg (ref 27.0–33.0)
MCHC: 33.3 g/dL (ref 32.0–36.0)
MCV: 89.9 fL (ref 80.0–100.0)
MPV: 11 fL (ref 7.5–12.5)
Monocytes Relative: 8.1 %
NEUTROS ABS: 2608 {cells}/uL (ref 1500–7800)
NEUTROS PCT: 49.2 %
PLATELETS: 239 10*3/uL (ref 140–400)
RBC: 4.34 10*6/uL (ref 3.80–5.10)
RDW: 11.9 % (ref 11.0–15.0)
Total Lymphocyte: 38.9 %
WBC: 5.3 10*3/uL (ref 3.8–10.8)
WBCMIX: 429 {cells}/uL (ref 200–950)

## 2017-04-02 MED ORDER — LOSARTAN POTASSIUM 100 MG PO TABS: 100.0000 mg | ORAL_TABLET | Freq: Every day | ORAL | 1 refills | Status: DC

## 2017-04-02 MED ORDER — HYDROCHLOROTHIAZIDE 12.5 MG PO CAPS: 12.5000 mg | ORAL_CAPSULE | Freq: Every day | ORAL | 1 refills | Status: DC

## 2017-04-02 MED ORDER — ATENOLOL 25 MG PO TABS: ORAL_TABLET | ORAL | 1 refills | Status: DC

## 2017-04-02 NOTE — Patient Instructions (Signed)
follow up in 6 months with Shanda BumpsJessica for routine follow up  To make appt with Dr Renato Gailseed for knee injections when able.

## 2017-04-02 NOTE — Progress Notes (Signed)
Careteam: Patient Care Team: Lauree Chandler, NP as PCP - General (Geriatric Medicine)  Advanced Directive information    No Known Allergies  Chief Complaint  Patient presents with  . Medical Management of Chronic Issues    Pt is being seen for a routine visit with medication review.   . Other    Pt son and husband in room   . Medication Refill    Rx have been pended.      HPI: Patient is a 72 y.o. female seen in the office today for routine follow up.   Did not take blood pressure medication this morning because she is fasting.   No recent issues, doing well.  Needing refills.   OA- knees are bothering her again, last injection lasted her a long time. Would like to see Dr Mariea Clonts for another injection  Doing routine walking. Has been working on diet; trying to work on heart healthy diet. Eating more vegetables.   Declines flu vaccine but will get PNA vaccine.   Would like to try cologuard again- did not get it when order last time.   Review of Systems: provided by son due to language barrier  Review of Systems  Constitutional: Negative for chills, fever and weight loss.  HENT: Negative for tinnitus.   Respiratory: Negative for cough, sputum production and shortness of breath.   Cardiovascular: Negative for chest pain, palpitations and leg swelling.  Gastrointestinal: Negative for abdominal pain, constipation, diarrhea and heartburn.  Genitourinary: Negative for dysuria, frequency and urgency.  Musculoskeletal: Positive for joint pain. Negative for back pain, falls and myalgias.  Skin: Negative.   Neurological: Negative for dizziness and headaches.  Psychiatric/Behavioral: Negative for depression and memory loss. The patient does not have insomnia.     Past Medical History:  Diagnosis Date  . Arthritis   . High blood pressure    Past Surgical History:  Procedure Laterality Date  . ABDOMINAL HYSTERECTOMY  2013   due to cancer in uterus   . CATARACT  EXTRACTION W/ INTRAOCULAR LENS  IMPLANT, BILATERAL  2014  . PARTIAL NEPHRECTOMY  2014   Right upper, mass   Social History:   reports that  has never smoked. she has never used smokeless tobacco. She reports that she does not drink alcohol or use drugs.  Family History  Problem Relation Age of Onset  . Arthritis Mother   . Arthritis Father     Medications: Patient's Medications  New Prescriptions   No medications on file  Previous Medications   ATENOLOL (TENORMIN) 25 MG TABLET    TAKE 1 TABLET BY MOUTH EVERY DAY FOR BLOOD PRESSURE   CALCIUM CARBONATE-VITAMIN D (CALTRATE 600+D) 600-400 MG-UNIT TABLET    Take 1 tablet by mouth 2 (two) times daily.   HYDROCHLOROTHIAZIDE (MICROZIDE) 12.5 MG CAPSULE    TAKE 1 CAPSULE (12.5 MG TOTAL) BY MOUTH DAILY. FOR BLOOD PRESSURE   LOSARTAN (COZAAR) 100 MG TABLET    TAKE 1 TABLET (100 MG TOTAL) BY MOUTH DAILY. FOR BLOOD PRESSURE  Modified Medications   No medications on file  Discontinued Medications   No medications on file     Physical Exam:  Vitals:   04/02/17 1052  BP: (!) 160/98  Pulse: 61  Temp: 97.8 F (36.6 C)  TempSrc: Oral  SpO2: 96%  Weight: 197 lb (89.4 kg)  Height: _0  (1.499 m)   Body mass index is 39.79 kg/m.  Physical Exam  Constitutional: She is oriented to person,  place, and time. She appears well-developed and well-nourished. No distress.  HENT:  Head: Normocephalic and atraumatic.  Mouth/Throat: No oropharyngeal exudate.  Eyes: Conjunctivae are normal. Pupils are equal, round, and reactive to light.  Neck: Normal range of motion. Neck supple.  Cardiovascular: Normal rate, regular rhythm and normal heart sounds.  Pulmonary/Chest: Effort normal and breath sounds normal.  Abdominal: Soft. Bowel sounds are normal. She exhibits no distension. There is no tenderness.  Obese abdomen   Musculoskeletal: She exhibits tenderness (to bilateral knees and lower back). She exhibits no edema.  Neurological: She is alert  and oriented to person, place, and time. She has normal reflexes.  Skin: Skin is warm and dry. She is not diaphoretic.  Psychiatric: She has a normal mood and affect.    Labs reviewed: Basic Metabolic Panel: No results for input(s): NA, K, CL, CO2, GLUCOSE, BUN, CREATININE, CALCIUM, MG, PHOS, TSH in the last 8760 hours. Liver Function Tests: No results for input(s): AST, ALT, ALKPHOS, BILITOT, PROT, ALBUMIN in the last 8760 hours. No results for input(s): LIPASE, AMYLASE in the last 8760 hours. No results for input(s): AMMONIA in the last 8760 hours. CBC: No results for input(s): WBC, NEUTROABS, HGB, HCT, MCV, PLT in the last 8760 hours. Lipid Panel: No results for input(s): CHOL, HDL, LDLCALC, TRIG, CHOLHDL, LDLDIRECT in the last 8760 hours. TSH: No results for input(s): TSH in the last 8760 hours. A1C: No results found for: HGBA1C   Assessment/Plan 1. Primary osteoarthritis of both knees -continues tylenol PRN, knees are bothering her more again. It has been well over a year since previous injection and requesting another- will make appt with Dr Mariea Clonts for this.   2. Essential hypertension ?white coat syndrome. Rechecked blood pressure after medication was given but remains elevated. Son reports blood pressure was ~130s/80s at home when checked but not recently. Will have him recheck blood pressure at home and call office.  - atenolol (TENORMIN) 25 MG tablet; TAKE 1 TABLET BY MOUTH EVERY DAY FOR BLOOD PRESSURE  Dispense: 90 tablet; Refill: 1 - hydrochlorothiazide (MICROZIDE) 12.5 MG capsule; Take 1 capsule (12.5 mg total) by mouth daily. for blood pressure  Dispense: 90 capsule; Refill: 1 - losartan (COZAAR) 100 MG tablet; Take 1 tablet (100 mg total) by mouth daily. for blood pressure  Dispense: 90 tablet; Refill: 1 - CBC with Differential/Platelets  3. Mixed hyperlipidemia -has made changes in diet, will follow up labs.  - Lipid Panel - CMP with eGFR  4. Screening for colon  cancer - will hold off on refer to gastroenterology because they want to attempt to do cologuard again. Paperwork complete.   5. Need for pneumococcal vaccine - Pneumococcal conjugate vaccine 13-valent  6. Osteopenia -cont on calcium with Vit D and weight bearing activity   Next appt: 6 months for routine follow up [place]. Harle Battiest  [facility] [phone] 8 am - 5 pm) [phone] (after hours)

## 2017-04-03 ENCOUNTER — Other Ambulatory Visit: Payer: Self-pay

## 2017-04-03 ENCOUNTER — Other Ambulatory Visit: Payer: Self-pay | Admitting: Nurse Practitioner

## 2017-04-03 DIAGNOSIS — E785 Hyperlipidemia, unspecified: Secondary | ICD-10-CM

## 2017-04-03 MED ORDER — ROSUVASTATIN CALCIUM 10 MG PO TABS: 10.0000 mg | ORAL_TABLET | Freq: Every day | ORAL | 0 refills | Status: DC

## 2017-04-23 ENCOUNTER — Ambulatory Visit (INDEPENDENT_AMBULATORY_CARE_PROVIDER_SITE_OTHER): Payer: Medicare Other | Admitting: Internal Medicine

## 2017-04-23 ENCOUNTER — Encounter: Payer: Self-pay | Admitting: Internal Medicine

## 2017-04-23 VITALS — BP 130/78 | HR 71 | Temp 97.7°F | Ht 59.0 in | Wt 199.4 lb

## 2017-04-23 DIAGNOSIS — M170 Bilateral primary osteoarthritis of knee: Secondary | ICD-10-CM

## 2017-04-23 MED ORDER — METHYLPREDNISOLONE ACETATE 40 MG/ML IJ SUSP
40.0000 mg | Freq: Once | INTRAMUSCULAR | Status: AC
  Administered 2017-04-23: 40 mg via INTRA_ARTICULAR

## 2017-04-23 NOTE — Progress Notes (Signed)
Location:  [facility] clinic Provider:  Amybeth Sieg L. Renato Gails, D.O., C.M.D.  Code Status: full code Goals of Care:  Advanced Directives 03/10/2016  Does Patient Have a Medical Advance Directive? No  Would patient like information on creating a medical advance directive? Yes (MAU/Ambulatory/Procedural Areas - Information given)   Chief Complaint  Patient presents with  . Follow-up    Knee Injection; both knees  . Medication Refill    No Refills    HPI: Patient is a 72 y.o. female seen today for bilateral knee steroid injections for osteoarthritis of the knees.  Pt sees NP Janyth Contes as PCP.    She had knee injections done last year and did very well with them.  Her son and an interpreter were present for the appt.  Pt reports her pain is interfering with walking and going upstairs.  She got relief last time and was able to walk much more with her son.  Last time, she had not eaten before the injections and became weak and fatigued afterwards.  She did respond to an orange-banana smoothie and was back to herself.    Past Medical History:  Diagnosis Date  . Arthritis   . High blood pressure     Past Surgical History:  Procedure Laterality Date  . ABDOMINAL HYSTERECTOMY  2013   due to cancer in uterus   . CATARACT EXTRACTION W/ INTRAOCULAR LENS  IMPLANT, BILATERAL  2014  . PARTIAL NEPHRECTOMY  2014   Right upper, mass    No Known Allergies  Outpatient Encounter Medications as of 04/23/2017  Medication Sig  . atenolol (TENORMIN) 25 MG tablet TAKE 1 TABLET BY MOUTH EVERY DAY FOR BLOOD PRESSURE  . Calcium Carbonate-Vitamin D (CALTRATE 600+D) 600-400 MG-UNIT tablet Take 1 tablet by mouth 2 (two) times daily.  . hydrochlorothiazide (MICROZIDE) 12.5 MG capsule Take 1 capsule (12.5 mg total) by mouth daily. for blood pressure  . losartan (COZAAR) 100 MG tablet Take 1 tablet (100 mg total) by mouth daily. for blood pressure  . rosuvastatin (CRESTOR) 10 MG tablet Take 1 tablet (10 mg total) by mouth  daily.   No facility-administered encounter medications on file as of 04/23/2017.     Review of Systems:  Review of Systems  Constitutional: Negative for chills, fever and malaise/fatigue.  Musculoskeletal: Positive for joint pain.  Neurological: Negative for weakness.    Health Maintenance  Topic Date Due  . INFLUENZA VACCINE  12/03/2017 (Originally 10/22/2016)  . TETANUS/TDAP  04/02/2018 (Originally [birth year]1964)  . Hepatitis C Screening  04/02/2018 (Originally [birth year])  . COLONOSCOPY  11/20/2023 (Originally 03/25/1995)  . MAMMOGRAM  11/14/2017  . DEXA SCAN  Completed  . PNA vac Low Risk Adult  Completed    Physical Exam: Vitals:   04/23/17 1458  BP: 130/78  Pulse: 71  Temp: 97.7 F (36.5 C)  TempSrc: Oral  SpO2: 95%  Weight: 199 lb 6.4 oz (90.4 kg)  Height: 4\' 11"  (1.499 m)   Body mass index is 40.27 kg/m. Physical Exam  Constitutional: She appears well-developed and well-nourished.  Pulmonary/Chest: Effort normal.  Musculoskeletal:  Tenderness of bilateral knees (and ticklish per son), crepitus present  Neurological: She is alert.  Skin: Skin is warm and dry.    Labs reviewed: Basic Metabolic Panel: Recent Labs    04/02/17 1148  NA 139  K 4.5  CL 99  CO2 34*  GLUCOSE 117*  BUN 12  CREATININE 0.80  CALCIUM 10.0   Liver Function Tests: Recent Labs  04/02/17 1148  AST 16  ALT 12  BILITOT 0.7  PROT 7.1   No results for input(s): LIPASE, AMYLASE in the last 8760 hours. No results for input(s): AMMONIA in the last 8760 hours. CBC: Recent Labs    04/02/17 1148  WBC 5.3  NEUTROABS 2,608  HGB 13.0  HCT 39.0  MCV 89.9  PLT 239   Lipid Panel: Recent Labs    04/02/17 1148  CHOL 221*  HDL 60  TRIG 73  CHOLHDL 3.7   Assessment/Plan 1. Bilateral primary osteoarthritis of knee -performed bilateral knee injections medially  Procedure note right knee injection verbal consent was obtained to inject right knee joint  Timeout was completed to  confirm the site of injection  The medications used were 40 mg of Depo-Medrol and 1% lidocaine 3 cc  Anesthesia was provided by ethyl chloride and the skin was prepped with alcohol.  After cleaning the skin with alcohol a 20-gauge needle was used to inject the right knee joint. There were no complications. A sterile bandage was applied.  Procedure note left knee injection verbal consent was obtained to inject left knee joint  Timeout was completed to confirm the site of injection  The medications used were 40 mg of Depo-Medrol and 1% lidocaine 3 cc  Anesthesia was provided by ethyl chloride and the skin was prepped with alcohol.  After cleaning the skin with alcohol a 20-gauge needle was used to inject the left knee joint. There were no complications. A sterile bandage was applied.  Labs/tests ordered:  No orders of the defined types were placed in this encounter.  Next appt:  06/29/2017  Aliviah Spain L. Hussain Maimone, D.O. Geriatrics [facility] [address] [place], [place] [address] Cell Phone (Mon-Fri 8am-5pm):  [phone] On Call:  [phone] & follow prompts after 5pm & weekends Office Phone:  [phone] Office Fax:  [phone]

## 2017-06-03 ENCOUNTER — Telehealth: Payer: Self-pay

## 2017-06-03 NOTE — Telephone Encounter (Signed)
I left a message on patient's son voicemail asking that he call the office to discuss whether or not patient plans to complete cologuard. A letter was received stating that patient has not returned specimen kit.

## 2017-06-05 NOTE — Telephone Encounter (Signed)
I left a message for patient's son to call the office.  

## 2017-06-26 ENCOUNTER — Other Ambulatory Visit: Payer: Self-pay

## 2017-06-26 DIAGNOSIS — E785 Hyperlipidemia, unspecified: Secondary | ICD-10-CM

## 2017-06-29 ENCOUNTER — Ambulatory Visit (INDEPENDENT_AMBULATORY_CARE_PROVIDER_SITE_OTHER): Payer: Medicare Other

## 2017-06-29 ENCOUNTER — Other Ambulatory Visit: Payer: Self-pay

## 2017-06-29 VITALS — BP 130/64 | HR 92 | Temp 97.3°F | Ht 59.0 in | Wt 198.0 lb

## 2017-06-29 DIAGNOSIS — E785 Hyperlipidemia, unspecified: Secondary | ICD-10-CM | POA: Diagnosis not present

## 2017-06-29 DIAGNOSIS — Z0000 Encounter for general adult medical examination without abnormal findings: Secondary | ICD-10-CM | POA: Diagnosis not present

## 2017-06-29 LAB — COMPLETE METABOLIC PANEL WITH GFR
AG RATIO: 1.4 (calc) (ref 1.0–2.5)
ALBUMIN MSPROF: 3.8 g/dL (ref 3.6–5.1)
ALT: 12 U/L (ref 6–29)
AST: 15 U/L (ref 10–35)
Alkaline phosphatase (APISO): 55 U/L (ref 33–130)
BILIRUBIN TOTAL: 0.7 mg/dL (ref 0.2–1.2)
BUN: 19 mg/dL (ref 7–25)
CALCIUM: 9.7 mg/dL (ref 8.6–10.4)
CHLORIDE: 101 mmol/L (ref 98–110)
CO2: 34 mmol/L — ABNORMAL HIGH (ref 20–32)
Creat: 0.88 mg/dL (ref 0.60–0.93)
GFR, EST AFRICAN AMERICAN: 76 mL/min/{1.73_m2} (ref >=60)
GFR, EST NON AFRICAN AMERICAN: 66 mL/min/{1.73_m2} (ref >=60)
GLUCOSE: 140 mg/dL — AB (ref 65–99)
Globulin: 2.8 g/dL (calc) (ref 1.9–3.7)
Potassium: 4.5 mmol/L (ref 3.5–5.3)
Sodium: 140 mmol/L (ref 135–146)
TOTAL PROTEIN: 6.6 g/dL (ref 6.1–8.1)

## 2017-06-29 LAB — LIPID PANEL
Cholesterol: 129 mg/dL (ref <200)
HDL: 45 mg/dL — AB (ref >50)
LDL Cholesterol (Calc): 71 mg/dL (calc)
Non-HDL Cholesterol (Calc): 84 mg/dL (calc) (ref <130)
Total CHOL/HDL Ratio: 2.9 (calc) (ref <5.0)
Triglycerides: 52 mg/dL (ref <150)

## 2017-06-29 NOTE — Patient Instructions (Signed)
Kerri Chan , Thank you for taking time to come for your Medicare Wellness Visit. I appreciate your ongoing commitment to your health goals. Please review the following plan we discussed and let me know if I can assist you in the future.   Screening recommendations/referrals: Colonoscopy due, please complete cologuard Mammogram up to date, due 11/14/2017 Bone Density up to date, due 11/14/2017 Recommended yearly ophthalmology/optometry visit for glaucoma screening and checkup Recommended yearly dental visit for hygiene and checkup  Vaccinations: Influenza vaccine due in fall season 2019 Pneumococcal vaccine up to date, completed Tdap vaccine up to date, due 03/25/2023 Shingles vaccine due    Advanced directives: Advance directive discussed with you today. I have provided a copy for you to complete at home and have notarized. Once this is complete please bring a copy in to our office so we can scan it into your chart.  Conditions/risks identified: none  Next appointment: Abbey Chatters NP, 07/06/2017 @ 8:30am             Tyron Russell, RN 06/30/2018 @ 9:15am   Preventive Care 65 Years and Older, Female Preventive care refers to lifestyle choices and visits with your health care provider that can promote health and wellness. What does preventive care include?  A yearly physical exam. This is also called an annual well check.  Dental exams once or twice a year.  Routine eye exams. Ask your health care provider how often you should have your eyes checked.  Personal lifestyle choices, including:  Daily care of your teeth and gums.  Regular physical activity.  Eating a healthy diet.  Avoiding tobacco and drug use.  Limiting alcohol use.  Practicing safe sex.  Taking low-dose aspirin every day.  Taking vitamin and mineral supplements as recommended by your health care provider. What happens during an annual well check? The services and screenings done by your health care provider  during your annual well check will depend on your age, overall health, lifestyle risk factors, and family history of disease. Counseling  Your health care provider may ask you questions about your:  Alcohol use.  Tobacco use.  Drug use.  Emotional well-being.  Home and relationship well-being.  Sexual activity.  Eating habits.  History of falls.  Memory and ability to understand (cognition).  Work and work Astronomer.  Reproductive health. Screening  You may have the following tests or measurements:  Height, weight, and BMI.  Blood pressure.  Lipid and cholesterol levels. These may be checked every 5 years, or more frequently if you are over 4 years old.  Skin check.  Lung cancer screening. You may have this screening every year starting at age 7 if you have a 30-pack-year history of smoking and currently smoke or have quit within the past 15 years.  Fecal occult blood test (FOBT) of the stool. You may have this test every year starting at age 21.  Flexible sigmoidoscopy or colonoscopy. You may have a sigmoidoscopy every 5 years or a colonoscopy every 10 years starting at age 3.  Hepatitis C blood test.  Hepatitis B blood test.  Sexually transmitted disease (STD) testing.  Diabetes screening. This is done by checking your blood sugar (glucose) after you have not eaten for a while (fasting). You may have this done every 1-3 years.  Bone density scan. This is done to screen for osteoporosis. You may have this done starting at age 16.  Mammogram. This may be done every 1-2 years. Talk to your health care  provider about how often you should have regular mammograms. Talk with your health care provider about your test results, treatment options, and if necessary, the need for more tests. Vaccines  Your health care provider may recommend certain vaccines, such as:  Influenza vaccine. This is recommended every year.  Tetanus, diphtheria, and acellular pertussis  (Tdap, Td) vaccine. You may need a Td booster every 10 years.  Zoster vaccine. You may need this after age 560.  Pneumococcal 13-valent conjugate (PCV13) vaccine. One dose is recommended after age 72.  Pneumococcal polysaccharide (PPSV23) vaccine. One dose is recommended after age 865. Talk to your health care provider about which screenings and vaccines you need and how often you need them. This information is not intended to replace advice given to you by your health care provider. Make sure you discuss any questions you have with your health care provider. Document Released: 04/06/2015 Document Revised: 11/28/2015 Document Reviewed: 01/09/2015 Elsevier Interactive Patient Education  2017 [employer]  Fall Prevention in the Home Falls can cause injuries. They can happen to people of all ages. There are many things you can do to make your home safe and to help prevent falls. What can I do on the outside of my home?  Regularly fix the edges of walkways and driveways and fix any cracks.  Remove anything that might make you trip as you walk through a door, such as a raised step or threshold.  Trim any bushes or trees on the path to your home.  Use bright outdoor lighting.  Clear any walking paths of anything that might make someone trip, such as rocks or tools.  Regularly check to see if handrails are loose or broken. Make sure that both sides of any steps have handrails.  Any raised decks and porches should have guardrails on the edges.  Have any leaves, snow, or ice cleared regularly.  Use sand or salt on walking paths during winter.  Clean up any spills in your garage right away. This includes oil or grease spills. What can I do in the bathroom?  Use night lights.  Install grab bars by the toilet and in the tub and shower. Do not use towel bars as grab bars.  Use non-skid mats or decals in the tub or shower.  If you need to sit down in the shower, use a plastic, non-slip  stool.  Keep the floor dry. Clean up any water that spills on the floor as soon as it happens.  Remove soap buildup in the tub or shower regularly.  Attach bath mats securely with double-sided non-slip rug tape.  Do not have throw rugs and other things on the floor that can make you trip. What can I do in the bedroom?  Use night lights.  Make sure that you have a light by your bed that is easy to reach.  Do not use any sheets or blankets that are too big for your bed. They should not hang down onto the floor.  Have a firm chair that has side arms. You can use this for support while you get dressed.  Do not have throw rugs and other things on the floor that can make you trip. What can I do in the kitchen?  Clean up any spills right away.  Avoid walking on wet floors.  Keep items that you use a lot in easy-to-reach places.  If you need to reach something above you, use a strong step stool that has a grab bar.  Keep electrical cords out of the way.  Do not use floor polish or wax that makes floors slippery. If you must use wax, use non-skid floor wax.  Do not have throw rugs and other things on the floor that can make you trip. What can I do with my stairs?  Do not leave any items on the stairs.  Make sure that there are handrails on both sides of the stairs and use them. Fix handrails that are broken or loose. Make sure that handrails are as long as the stairways.  Check any carpeting to make sure that it is firmly attached to the stairs. Fix any carpet that is loose or worn.  Avoid having throw rugs at the top or bottom of the stairs. If you do have throw rugs, attach them to the floor with carpet tape.  Make sure that you have a light switch at the top of the stairs and the bottom of the stairs. If you do not have them, ask someone to add them for you. What else can I do to help prevent falls?  Wear shoes that:  Do not have high heels.  Have rubber bottoms.  Are  comfortable and fit you well.  Are closed at the toe. Do not wear sandals.  If you use a stepladder:  Make sure that it is fully opened. Do not climb a closed stepladder.  Make sure that both sides of the stepladder are locked into place.  Ask someone to hold it for you, if possible.  Clearly mark and make sure that you can see:  Any grab bars or handrails.  First and last steps.  Where the edge of each step is.  Use tools that help you move around (mobility aids) if they are needed. These include:  Canes.  Walkers.  Scooters.  Crutches.  Turn on the lights when you go into a dark area. Replace any light bulbs as soon as they burn out.  Set up your furniture so you have a clear path. Avoid moving your furniture around.  If any of your floors are uneven, fix them.  If there are any pets around you, be aware of where they are.  Review your medicines with your doctor. Some medicines can make you feel dizzy. This can increase your chance of falling. Ask your doctor what other things that you can do to help prevent falls. This information is not intended to replace advice given to you by your health care provider. Make sure you discuss any questions you have with your health care provider. Document Released: 01/04/2009 Document Revised: 08/16/2015 Document Reviewed: 04/14/2014 Elsevier Interactive Patient Education  2017 [employer].

## 2017-06-29 NOTE — Progress Notes (Signed)
Subjective:   Marrietta Thunder is a 72 y.o. female who presents for an Initial Medicare Annual Wellness Visit.  Welcome to medicare visit was completed on: 10/29/2015     Objective:    Today's Vitals   06/29/17 0945  BP: 130/64  Pulse: 92  Temp: (!) 97.3 F (36.3 C)  TempSrc: Oral  SpO2: (!) 59%  Weight: 198 lb (89.8 kg)  Height: 4' 11"  (1.499 m)   Body mass index is 39.99 kg/m.  Advanced Directives 06/29/2017 03/10/2016 12/10/2015 10/29/2015 10/04/2015  Does Patient Have a Medical Advance Directive? No No No - No  Would patient like information on creating a medical advance directive? Yes (MAU/Ambulatory/Procedural Areas - Information given) Yes (MAU/Ambulatory/Procedural Areas - Information given) No - patient declined information No - patient declined information No - patient declined information    Current Medications (verified) Outpatient Encounter Medications as of 06/29/2017  Medication Sig  . atenolol (TENORMIN) 25 MG tablet TAKE 1 TABLET BY MOUTH EVERY DAY FOR BLOOD PRESSURE  . Calcium Carbonate-Vitamin D (CALTRATE 600+D) 600-400 MG-UNIT tablet Take 1 tablet by mouth 2 (two) times daily.  . hydrochlorothiazide (MICROZIDE) 12.5 MG capsule Take 1 capsule (12.5 mg total) by mouth daily. for blood pressure  . losartan (COZAAR) 100 MG tablet Take 1 tablet (100 mg total) by mouth daily. for blood pressure  . rosuvastatin (CRESTOR) 10 MG tablet Take 1 tablet (10 mg total) by mouth daily.   No facility-administered encounter medications on file as of 06/29/2017.     Allergies (verified) Patient has no known allergies.   History: Past Medical History:  Diagnosis Date  . Arthritis   . High blood pressure    Past Surgical History:  Procedure Laterality Date  . ABDOMINAL HYSTERECTOMY  2013   due to cancer in uterus   . CATARACT EXTRACTION W/ INTRAOCULAR LENS  IMPLANT, BILATERAL  2014  . PARTIAL NEPHRECTOMY  2014   Right upper, mass   Family History  Problem Relation Age of  Onset  . Arthritis Mother   . Arthritis Father    Social History   Socioeconomic History  . Marital status: Married    Spouse name: Not on file  . Number of children: Not on file  . Years of education: Not on file  . Highest education level: Not on file  Occupational History  . Not on file  Social Needs  . Financial resource strain: Not hard at all  . Food insecurity:    Worry: Never true    Inability: Never true  . Transportation needs:    Medical: No    Non-medical: No  Tobacco Use  . Smoking status: Never Smoker  . Smokeless tobacco: Never Used  Substance and Sexual Activity  . Alcohol use: No    Alcohol/week: 0.0 oz  . Drug use: No  . Sexual activity: Not Currently  Lifestyle  . Physical activity:    Days per week: 4 days    Minutes per session: 20 min  . Stress: Only a little  Relationships  . Social connections:    Talks on phone: More than three times a week    Gets together: More than three times a week    Attends religious service: Never    Active member of club or organization: No    Attends meetings of clubs or organizations: Never    Relationship status: Married  Other Topics Concern  . Not on file  Social History Narrative   Diet? Healthy  Do you drink/eat things with caffeine? Black and Green Tea      Marital status?                   m                 What year were you married? 1968      Do you live in a house, apartment, assisted living, condo, trailer, etc.? apartment      Is it one or more stories? 2 story      How many persons live in your home? 3 (husband and son)      Do you have any pets in your home? (please list) 1- a parrot (small bird)      Current or past profession: -      Do you exercise?      walk                                Type & how often? light      Do you have a living will? no      Do you have a DNR form?      no                            If not, do you want to discuss one? N/A      Do you have signed  POA/HPOA for forms? N/A       Tobacco Counseling Counseling given: Not Answered   Clinical Intake:  Pre-visit preparation completed: No  Pain : No/denies pain     Nutritional Risks: None Diabetes: No  How often do you need to have someone help you when you read instructions, pamphlets, or other written materials from your doctor or pharmacy?: 1 - Never What is the last grade level you completed in school?: 10th grade  Interpreter Needed?: Yes  Information entered by :: Tyson Dense, RN   Activities of Daily Living In your present state of health, do you have any difficulty performing the following activities: 06/29/2017  Hearing? N  Vision? N  Difficulty concentrating or making decisions? N  Walking or climbing stairs? Y  Dressing or bathing? N  Doing errands, shopping? Y  Preparing Food and eating ? N  Using the Toilet? N  In the past six months, have you accidently leaked urine? N  Do you have problems with loss of bowel control? N  Managing your Medications? N  Managing your Finances? N  Housekeeping or managing your Housekeeping? N  Some recent data might be hidden     Immunizations and Health Maintenance Immunization History  Administered Date(s) Administered  . Influenza,inj,Quad PF,6+ Mos 03/10/2016  . Influenza-Unspecified 12/22/2013  . Pneumococcal Conjugate-13 04/02/2017  . Pneumococcal-Unspecified 12/22/2013   There are no preventive care reminders to display for this patient.  Patient Care Team: Lauree Chandler, NP as PCP - General (Geriatric Medicine)  Indicate any recent Medical Services you may have received from other than [facility] providers in the past year (date may be approximate).     Assessment:   This is a routine wellness examination for Monasia.  Hearing/Vision screen Hearing Screening Comments: Pt reports no issues with hearing Vision Screening Comments: Sees eye doctor annually   Dietary issues and exercise activities  discussed: Current Exercise Habits: Home exercise routine, Type of exercise: walking, Time (Minutes): 20,  Frequency (Times/Week): 4, Weekly Exercise (Minutes/Week): 80, Intensity: Mild, Exercise limited by: None identified  Goals    None     Depression Screen PHQ 2/9 Scores 06/29/2017 04/23/2017 10/29/2015 10/04/2015  PHQ - 2 Score 0 0 0 0    Fall Risk Fall Risk  06/29/2017 04/23/2017 04/02/2017 10/29/2015 10/04/2015  Falls in the past year? No No No No No    Is the patient's home free of loose throw rugs in walkways, pet beds, electrical cords, etc?   yes      Grab bars in the bathroom? yes      Handrails on the stairs?   yes      Adequate lighting?   yes  Cognitive Function: MMSE - Mini Mental State Exam 06/29/2017 10/29/2015  Not completed: Unable to complete -  Orientation to time 3 4  Orientation to Place 2 3  Registration 3 3  Attention/ Calculation 0 5  Recall 3 3  Language- name 2 objects 2 2  Language- repeat 1 1  Language- follow 3 step command 3 3  Language- read & follow direction 1 1  Write a sentence 1 1  Copy design 1 1  Total score 20 27        Screening Tests Health Maintenance  Topic Date Due  . INFLUENZA VACCINE  12/03/2017 (Originally 10/22/2017)  . TETANUS/TDAP  04/02/2018 (Originally 03/24/1964)  . Hepatitis C Screening  04/02/2018 (Originally [birth year])  . COLONOSCOPY  11/20/2023 (Originally 03/25/1995)  . MAMMOGRAM  11/14/2017  . DEXA SCAN  Completed  . PNA vac Low Risk Adult  Completed    Qualifies for Shingles Vaccine? Yes, educated and patient wants to think about it  Cancer Screenings: Lung: Low Dose CT Chest recommended if Age 8-80 years, 30 pack-year currently smoking OR have quit w/in 15years. Patient does not qualify. Breast: Up to date on Mammogram? Yes   Up to date of Bone Density/Dexa? Yes Colorectal: Patient has cologuard kit at home  Additional Screenings:  Hepatitis C Screening: declined Pt declined flu vaccine    Plan:    I have  personally reviewed and addressed the Medicare Annual Wellness questionnaire and have noted the following in the patient's chart:  A. Medical and social history B. Use of alcohol, tobacco or illicit drugs  C. Current medications and supplements D. Functional ability and status E.  Nutritional status F.  Physical activity G. Advance directives H. List of other physicians I.  Hospitalizations, surgeries, and ER visits in previous 12 months J.  [place] to include hearing, vision, cognitive, depression L. Referrals and appointments - none  In addition, I have reviewed and discussed with patient certain preventive protocols, quality metrics, and best practice recommendations. A written personalized care plan for preventive services as well as general preventive health recommendations were provided to patient.  See attached scanned questionnaire for additional information.   Signed,   Tyson Dense, RN Nurse Health Advisor  Patient Concerns:None

## 2017-07-06 ENCOUNTER — Ambulatory Visit (INDEPENDENT_AMBULATORY_CARE_PROVIDER_SITE_OTHER): Payer: Medicare Other | Admitting: Nurse Practitioner

## 2017-07-06 ENCOUNTER — Encounter: Payer: Self-pay | Admitting: Nurse Practitioner

## 2017-07-06 VITALS — BP 120/80 | HR 70 | Temp 98.1°F | Ht 59.0 in | Wt 199.0 lb

## 2017-07-06 DIAGNOSIS — K5900 Constipation, unspecified: Secondary | ICD-10-CM | POA: Diagnosis not present

## 2017-07-06 DIAGNOSIS — R739 Hyperglycemia, unspecified: Secondary | ICD-10-CM | POA: Diagnosis not present

## 2017-07-06 DIAGNOSIS — E785 Hyperlipidemia, unspecified: Secondary | ICD-10-CM | POA: Diagnosis not present

## 2017-07-06 DIAGNOSIS — I10 Essential (primary) hypertension: Secondary | ICD-10-CM | POA: Diagnosis not present

## 2017-07-06 DIAGNOSIS — M170 Bilateral primary osteoarthritis of knee: Secondary | ICD-10-CM | POA: Diagnosis not present

## 2017-07-06 NOTE — Progress Notes (Signed)
Careteam: Patient Care Team: [name], NP as PCP - General (Geriatric Medicine)  Advanced Directive information    No Known Allergies  Chief Complaint  Patient presents with  . Medical Management of Chronic Issues    Pt is being seen for a 3 month routine visit. Pt is having some issure with constipation.   . Other    Son in room      HPI: Patient is a 72 y.o. female seen in the office today for routine follow up.   OA- knee injections by Dr Renato Gails on 04/02/17, still getting good pain relief much more mobile per son, occasionally will need ice and elevation.   Hyperlipidemia- LDL 144 3 months ago, lipitor 10 mg daily started, no side effects noted and follow up LDL at goal 71  Hyperglycemia- elevated on fasting labs, will need A1c, has cut out sweet and working on dietary modifications.   HTN- elevated today, anxious and apprehensive over office visit, son reports blood pressure at home in the 120s/80s. 120/84 on last check.    Waiting on husband in regards to cologuard- he will have to help coordinate getting this done.    Review of Systems:  Review of Systems  Constitutional: Negative for chills, fever and weight loss.  HENT: Negative for hearing loss.   Respiratory: Negative for cough, sputum production and shortness of breath.   Cardiovascular: Negative for chest pain, palpitations and leg swelling.  Gastrointestinal: Positive for constipation (occasional). Negative for abdominal pain, diarrhea and heartburn.  Genitourinary: Negative for dysuria and frequency.  Musculoskeletal: Positive for joint pain. Negative for back pain, falls and myalgias.  Skin: Negative.   Neurological: Negative for dizziness and headaches.  Psychiatric/Behavioral: Negative for depression and memory loss. The patient does not have insomnia.     Past Medical History:  Diagnosis Date  . Arthritis   . High blood pressure    Past Surgical History:  Procedure Laterality Date  .  ABDOMINAL HYSTERECTOMY  2013   due to cancer in uterus   . CATARACT EXTRACTION W/ INTRAOCULAR LENS  IMPLANT, BILATERAL  2014  . PARTIAL NEPHRECTOMY  2014   Right upper, mass   Social History:   reports that she has never smoked. She has never used smokeless tobacco. She reports that she does not drink alcohol or use drugs.  Family History  Problem Relation Age of Onset  . Arthritis Mother   . Arthritis Father     Medications: Patient's Medications  New Prescriptions   No medications on file  Previous Medications   ATENOLOL (TENORMIN) 25 MG TABLET    TAKE 1 TABLET BY MOUTH EVERY DAY FOR BLOOD PRESSURE   CALCIUM CARBONATE-VITAMIN D (CALTRATE 600+D) 600-400 MG-UNIT TABLET    Take 1 tablet by mouth 2 (two) times daily.   HYDROCHLOROTHIAZIDE (MICROZIDE) 12.5 MG CAPSULE    Take 1 capsule (12.5 mg total) by mouth daily. for blood pressure   LOSARTAN (COZAAR) 100 MG TABLET    Take 1 tablet (100 mg total) by mouth daily. for blood pressure   ROSUVASTATIN (CRESTOR) 10 MG TABLET    Take 1 tablet (10 mg total) by mouth daily.  Modified Medications   No medications on file  Discontinued Medications   No medications on file     Physical Exam:  Vitals:   07/06/17 0839  BP: (!) 142/90  Pulse: 70  Temp: 98.1 F (36.7 C)  TempSrc: Oral  SpO2: 98%  Weight: 199 lb (90.3  kg)  Height: 4\' 11"  (1.499 m)   Body mass index is 40.19 kg/m.  Physical Exam  Constitutional: She is oriented to person, place, and time. She appears well-developed and well-nourished. No distress.  HENT:  Head: Normocephalic and atraumatic.  Mouth/Throat: No oropharyngeal exudate.  Eyes: Pupils are equal, round, and reactive to light. Conjunctivae are normal.  Neck: Normal range of motion. Neck supple.  Cardiovascular: Normal rate, regular rhythm and normal heart sounds.  Pulmonary/Chest: Effort normal and breath sounds normal.  Abdominal: Soft. Bowel sounds are normal. She exhibits no distension. There is no  tenderness.  Obese abdomen   Musculoskeletal: She exhibits tenderness (to bilateral knees and lower back). She exhibits no edema.  Neurological: She is alert and oriented to person, place, and time. She has normal reflexes.  Skin: Skin is warm and dry. She is not diaphoretic.  Psychiatric: She has a normal mood and affect.    Labs reviewed: Basic Metabolic Panel: Recent Labs    04/02/17 1148 06/29/17 0836  NA 139 140  K 4.5 4.5  CL 99 101  CO2 34* 34*  GLUCOSE 117* 140*  BUN 12 19  CREATININE 0.80 0.88  CALCIUM 10.0 9.7   Liver Function Tests: Recent Labs    04/02/17 1148 06/29/17 0836  AST 16 15  ALT 12 12  BILITOT 0.7 0.7  PROT 7.1 6.6   No results for input(s): LIPASE, AMYLASE in the last 8760 hours. No results for input(s): AMMONIA in the last 8760 hours. CBC: Recent Labs    04/02/17 1148  WBC 5.3  NEUTROABS 2,608  HGB 13.0  HCT 39.0  MCV 89.9  PLT 239   Lipid Panel: Recent Labs    04/02/17 1148 06/29/17 0836  CHOL 221* 129  HDL 60 45*  LDLCALC 144* 71  TRIG 73 52  CHOLHDL 3.7 2.9   TSH: No results for input(s): TSH in the last 8760 hours. A1C: No results found for: HGBA1C   Assessment/Plan 1. Hyperglycemia -diet modifications encouraged, encouraged to increase activity/exercise  - Hemoglobin A1c  2. Hyperlipidemia, unspecified hyperlipidemia type LDL improved on crestor, to continue crestor 10 mg daily with dietary modifications  3. Bilateral primary osteoarthritis of knee Stable, still having benefit from injection in January.   4. Essential hypertension Improved on recheck to 120/80, continues on corzaar and hctz  5. Constipation, unspecified constipation type Stable on dietary modifications.   Next appt: 6 months, lab work before visit.  Janene HarveyJessica K. Biagio BorgEubanks, AGNP  [facility] [phone]

## 2017-07-06 NOTE — Patient Instructions (Signed)
Try to increase activity. Goal is 150 mins a week  Maybe set a 10 mins daily goal to start   DASH Eating Plan DASH stands for "Dietary Approaches to Stop Hypertension." The DASH eating plan is a healthy eating plan that has been shown to reduce high blood pressure (hypertension). It may also reduce your risk for type 2 diabetes, heart disease, and stroke. The DASH eating plan may also help with weight loss. What are tips for following this plan? General guidelines  Avoid eating more than 2,300 mg (milligrams) of salt (sodium) a day. If you have hypertension, you may need to reduce your sodium intake to 1,500 mg a day.  Limit alcohol intake to no more than 1 drink a day for nonpregnant women and 2 drinks a day for men. One drink equals 12 oz of beer, 5 oz of wine, or 1 oz of hard liquor.  Work with your health care provider to maintain a healthy body weight or to lose weight. Ask what an ideal weight is for you.  Get at least 30 minutes of exercise that causes your heart to beat faster (aerobic exercise) most days of the week. Activities may include walking, swimming, or biking.  Work with your health care provider or diet and nutrition specialist (dietitian) to adjust your eating plan to your individual calorie needs. Reading food labels  Check food labels for the amount of sodium per serving. Choose foods with less than 5 percent of the Daily Value of sodium. Generally, foods with less than 300 mg of sodium per serving fit into this eating plan.  To find whole grains, look for the word "whole" as the first word in the ingredient list. Shopping  Buy products labeled as "low-sodium" or "no salt added."  Buy fresh foods. Avoid canned foods and premade or frozen meals. Cooking  Avoid adding salt when cooking. Use salt-free seasonings or herbs instead of table salt or sea salt. Check with your health care provider or pharmacist before using salt substitutes.  Do not fry foods. Cook foods  using healthy methods such as baking, boiling, grilling, and broiling instead.  Cook with heart-healthy oils, such as olive, canola, soybean, or sunflower oil. Meal planning   Eat a balanced diet that includes: ? 5 or more servings of fruits and vegetables each day. At each meal, try to fill half of your plate with fruits and vegetables. ? Up to 6-8 servings of whole grains each day. ? Less than 6 oz of lean meat, poultry, or fish each day. A 3-oz serving of meat is about the same size as a deck of cards. One egg equals 1 oz. ? 2 servings of low-fat dairy each day. ? A serving of nuts, seeds, or beans 5 times each week. ? Heart-healthy fats. Healthy fats called Omega-3 fatty acids are found in foods such as flaxseeds and coldwater fish, like sardines, salmon, and mackerel.  Limit how much you eat of the following: ? Canned or prepackaged foods. ? Food that is high in trans fat, such as fried foods. ? Food that is high in saturated fat, such as fatty meat. ? Sweets, desserts, sugary drinks, and other foods with added sugar. ? Full-fat dairy products.  Do not salt foods before eating.  Try to eat at least 2 vegetarian meals each week.  Eat more home-cooked food and less restaurant, buffet, and fast food.  When eating at a restaurant, ask that your food be prepared with less salt or no  salt, if possible. What foods are recommended? The items listed may not be a complete list. Talk with your dietitian about what dietary choices are best for you. Grains Whole-grain or whole-wheat bread. Whole-grain or whole-wheat pasta. Brown rice. [place]row. Bulgur. Whole-grain and low-sodium cereals. Pita bread. Low-fat, low-sodium crackers. Whole-wheat flour tortillas. Vegetables Fresh or frozen vegetables (raw, steamed, roasted, or grilled). Low-sodium or reduced-sodium tomato and vegetable juice. Low-sodium or reduced-sodium tomato sauce and tomato paste. Low-sodium or reduced-sodium canned  vegetables. Fruits All fresh, dried, or frozen fruit. Canned fruit in natural juice (without added sugar). Meat and other protein foods Skinless chicken or [place]. Ground chicken or [place]. Pork with fat trimmed off. Fish and seafood. Egg whites. Dried beans, peas, or lentils. Unsalted nuts, nut butters, and seeds. Unsalted canned beans. Lean cuts of beef with fat trimmed off. Low-sodium, lean deli meat. Dairy Low-fat (1%) or fat-free (skim) milk. Fat-free, low-fat, or reduced-fat cheeses. Nonfat, low-sodium ricotta or cottage cheese. Low-fat or nonfat yogurt. Low-fat, low-sodium cheese. Fats and oils Soft margarine without trans fats. Vegetable oil. Low-fat, reduced-fat, or light mayonnaise and salad dressings (reduced-sodium). Canola, safflower, olive, soybean, and sunflower oils. Avocado. Seasoning and other foods Herbs. Spices. Seasoning mixes without salt. Unsalted popcorn and pretzels. Fat-free sweets. What foods are not recommended? The items listed may not be a complete list. Talk with your dietitian about what dietary choices are best for you. Grains Baked goods made with fat, such as croissants, muffins, or some breads. Dry pasta or rice meal packs. Vegetables Creamed or fried vegetables. Vegetables in a cheese sauce. Regular canned vegetables (not low-sodium or reduced-sodium). Regular canned tomato sauce and paste (not low-sodium or reduced-sodium). Regular tomato and vegetable juice (not low-sodium or reduced-sodium). Angie Fava. Olives. Fruits Canned fruit in a light or heavy syrup. Fried fruit. Fruit in cream or butter sauce. Meat and other protein foods Fatty cuts of meat. Ribs. Fried meat. Berniece Salines. Sausage. Bologna and other processed lunch meats. Salami. Fatback. Hotdogs. Bratwurst. Salted nuts and seeds. Canned beans with added salt. Canned or smoked fish. Whole eggs or egg yolks. Chicken or [place] with skin. Dairy Whole or 2% milk, cream, and half-and-half. Whole or full-fat  cream cheese. Whole-fat or sweetened yogurt. Full-fat cheese. Nondairy creamers. Whipped toppings. Processed cheese and cheese spreads. Fats and oils Butter. Stick margarine. Lard. Shortening. Ghee. Bacon fat. Tropical oils, such as coconut, palm kernel, or palm oil. Seasoning and other foods Salted popcorn and pretzels. Onion salt, garlic salt, seasoned salt, table salt, and sea salt. Worcestershire sauce. Tartar sauce. Barbecue sauce. Teriyaki sauce. Soy sauce, including reduced-sodium. Steak sauce. Canned and packaged gravies. Fish sauce. Oyster sauce. Cocktail sauce. Horseradish that you find on the shelf. Ketchup. Mustard. Meat flavorings and tenderizers. Bouillon cubes. Hot sauce and Tabasco sauce. Premade or packaged marinades. Premade or packaged taco seasonings. Relishes. Regular salad dressings. Where to find more information:  National Heart, Lung, and [place]: https://wilson-eaton.com/  American Heart Association: www.heart.org Summary  The DASH eating plan is a healthy eating plan that has been shown to reduce high blood pressure (hypertension). It may also reduce your risk for type 2 diabetes, heart disease, and stroke.  With the DASH eating plan, you should limit salt (sodium) intake to 2,300 mg a day. If you have hypertension, you may need to reduce your sodium intake to 1,500 mg a day.  When on the DASH eating plan, aim to eat more fresh fruits and vegetables, whole grains, lean proteins, low-fat dairy, and heart-healthy  fats.  Work with your health care provider or diet and nutrition specialist (dietitian) to adjust your eating plan to your individual calorie needs. This information is not intended to replace advice given to you by your health care provider. Make sure you discuss any questions you have with your health care provider. Document Released: 02/27/2011 Document Revised: 03/03/2016 Document Reviewed: 03/03/2016 Elsevier Interactive Patient Education  [employer].

## 2017-07-07 LAB — HEMOGLOBIN A1C
EAG (MMOL/L): 9.8 (calc)
Hgb A1c MFr Bld: 7.8 % of total Hgb — ABNORMAL HIGH (ref <5.7)
MEAN PLASMA GLUCOSE: 177 (calc)

## 2017-07-08 ENCOUNTER — Other Ambulatory Visit: Payer: Self-pay

## 2017-07-08 DIAGNOSIS — E119 Type 2 diabetes mellitus without complications: Secondary | ICD-10-CM

## 2017-07-08 MED ORDER — ONETOUCH ULTRA 2 W/DEVICE KIT: PACK | 0 refills | Status: DC

## 2017-07-08 MED ORDER — ONETOUCH LANCETS MISC: 11 refills | Status: DC

## 2017-07-08 MED ORDER — GLUCOSE BLOOD VI STRP: ORAL_STRIP | 11 refills | Status: DC

## 2017-07-08 NOTE — Telephone Encounter (Signed)
A glucose meter, test strips, and lancets were sent to the pharmacy electronically.

## 2017-07-13 ENCOUNTER — Encounter: Payer: Self-pay | Admitting: Pharmacotherapy

## 2017-07-13 ENCOUNTER — Ambulatory Visit (INDEPENDENT_AMBULATORY_CARE_PROVIDER_SITE_OTHER): Payer: Medicare Other | Admitting: Pharmacotherapy

## 2017-07-13 VITALS — BP 142/78 | HR 88 | Ht 59.0 in | Wt 196.6 lb

## 2017-07-13 DIAGNOSIS — I10 Essential (primary) hypertension: Secondary | ICD-10-CM | POA: Diagnosis not present

## 2017-07-13 DIAGNOSIS — E119 Type 2 diabetes mellitus without complications: Secondary | ICD-10-CM | POA: Diagnosis not present

## 2017-07-13 MED ORDER — ONETOUCH ULTRA 2 W/DEVICE KIT: PACK | 0 refills | Status: DC

## 2017-07-13 MED ORDER — ONETOUCH LANCETS MISC: 11 refills | Status: DC

## 2017-07-13 MED ORDER — GLUCOSE BLOOD VI STRP: ORAL_STRIP | 11 refills | Status: DC

## 2017-07-13 NOTE — Progress Notes (Signed)
  Subjective:    Kerri Chan is a 72 y.o. female who presents for follow-up of Type 2 diabetes mellitus.  First A1C was elevated at 7.8%. [name], NP started on low dose metformin - pharmacy did not fill RX yet because they needed more information.  She has a caregiver with her (her son)  He is interpreting visit for her. She has given up white sugar.  BP&J sandwiches. She loves fruit.  No dietary restrictions Limited walking - 10 minutes before rest. Does not own a BGM. No known family history of DM. Denies problems with vision.  Last eye exam was January 2019 [facility]) Denies problems with feet Some peripheral edema. Nocturia every 2-3 hours No dysuria Staying well hydrated - 4-5 mini-bottles per day.  Green tea.  Review of Systems A comprehensive review of systems was negative except for: Eyes: positive for contacts/glasses Cardiovascular: positive for lower extremity edema Genitourinary: positive for nocturia    Objective:    BP (!) 142/78   Pulse 88   Ht 4' 11"$  (1.499 m)   Wt 196 lb 9.6 oz (89.2 kg)   SpO2 91%   BMI 39.71 kg/m   General:  alert, cooperative and no distress  Oropharynx: normal findings: lips normal without lesions and gums healthy   Eyes:  negative findings: lids and lashes normal and conjunctivae and sclerae normal   Ears:  external ears normal        Lung: clear to auscultation bilaterally  Heart:  regular rate and rhythm     Extremities: edema bilateral lower extremities  Skin: warm and dry, no hyperpigmentation, vitiligo, or suspicious lesions     Neuro: mental status, speech normal, alert and oriented x3 and gait and station normal   Lab Review Glucose, Bld (mg/dL)  Date Value  06/29/2017 140 (H)  04/02/2017 117 (H)  03/10/2016 124 (H)   CO2 (mmol/L)  Date Value  06/29/2017 34 (H)  04/02/2017 34 (H)  03/10/2016 29   BUN (mg/dL)  Date Value  06/29/2017 19  04/02/2017 12  03/10/2016 15   Creat (mg/dL)  Date Value   06/29/2017 0.88  04/02/2017 0.80  03/10/2016 0.79       Assessment:    Diabetes Mellitus type II, under fair control. baseline A1C: 7.8% BP above goal <130/80   Plan:    1.  Rx changes: start metformin 59m daily x 1 week, then increase to 5075mtwice daily.  Counseled on risk / benefit and potential for loose stools when initiating medication.  2.  Will start to self-monitor blood glucose daily.  Will alternate time of testing for a better overall picture of control. 3.  Counseled on nutrition goals and meal planning.  Focused on plate method and portion control. 4.  Counseled on benefit of routine exercise.  Goal is 30-45 minutes 5 x week.  Praised current walking efforts. 5.  BP above goal <130/80.  Improved dietary and exercise habits may help.  Will monitor and continue losartan, HCTZ, atenolol for now. 6.  She has appointment with JeSherrie MustacheNP pending.

## 2017-07-13 NOTE — Patient Instructions (Signed)
[facility].

## 2017-07-17 ENCOUNTER — Other Ambulatory Visit: Payer: Self-pay

## 2017-07-17 MED ORDER — GLUCOSE BLOOD VI STRP: ORAL_STRIP | 11 refills | Status: AC

## 2017-07-17 MED ORDER — ONETOUCH ULTRA 2 W/DEVICE KIT: PACK | 0 refills | Status: AC

## 2017-07-17 MED ORDER — ONETOUCH LANCETS MISC: 11 refills | Status: AC

## 2017-07-17 NOTE — Telephone Encounter (Signed)
A fax was received from [facility] stating that the directions for use need to be changed for the One touch ultra 2 glucose meter and test strips.   Directions need to be as follows for non-insulin dependant type 2 diabetic patients: Use to check blood sugars one time daily. Dx: E11.9  Corrected Rx were sent for meter and test strips.

## 2017-09-19 ENCOUNTER — Other Ambulatory Visit: Payer: Self-pay | Admitting: Nurse Practitioner

## 2017-09-19 DIAGNOSIS — E785 Hyperlipidemia, unspecified: Secondary | ICD-10-CM

## 2017-09-19 DIAGNOSIS — I10 Essential (primary) hypertension: Secondary | ICD-10-CM

## 2017-09-22 ENCOUNTER — Ambulatory Visit: Payer: Medicare Other | Admitting: Nurse Practitioner

## 2017-10-14 ENCOUNTER — Ambulatory Visit: Payer: Self-pay | Admitting: Internal Medicine

## 2017-10-14 ENCOUNTER — Ambulatory Visit: Payer: Medicare Other | Admitting: Internal Medicine

## 2017-10-21 DIAGNOSIS — Z961 Presence of intraocular lens: Secondary | ICD-10-CM | POA: Diagnosis not present

## 2017-10-21 DIAGNOSIS — H16143 Punctate keratitis, bilateral: Secondary | ICD-10-CM | POA: Diagnosis not present

## 2018-01-14 ENCOUNTER — Other Ambulatory Visit: Payer: Medicare Other

## 2018-01-18 ENCOUNTER — Other Ambulatory Visit: Payer: Medicare Other

## 2018-01-18 DIAGNOSIS — E785 Hyperlipidemia, unspecified: Secondary | ICD-10-CM

## 2018-01-18 DIAGNOSIS — I10 Essential (primary) hypertension: Secondary | ICD-10-CM | POA: Diagnosis not present

## 2018-01-18 DIAGNOSIS — R739 Hyperglycemia, unspecified: Secondary | ICD-10-CM | POA: Diagnosis not present

## 2018-01-19 LAB — COMPLETE METABOLIC PANEL WITH GFR
AG Ratio: 1.4 (calc) (ref 1.0–2.5)
ALBUMIN MSPROF: 4.1 g/dL (ref 3.6–5.1)
ALT: 11 U/L (ref 6–29)
AST: 16 U/L (ref 10–35)
Alkaline phosphatase (APISO): 60 U/L (ref 33–130)
BUN: 14 mg/dL (ref 7–25)
CALCIUM: 9.8 mg/dL (ref 8.6–10.4)
CHLORIDE: 101 mmol/L (ref 98–110)
CO2: 33 mmol/L — AB (ref 20–32)
CREATININE: 0.75 mg/dL (ref 0.60–0.93)
GFR, EST AFRICAN AMERICAN: 92 mL/min/{1.73_m2} (ref >=60)
GFR, EST NON AFRICAN AMERICAN: 80 mL/min/{1.73_m2} (ref >=60)
GLUCOSE: 129 mg/dL — AB (ref 65–99)
Globulin: 3 g/dL (calc) (ref 1.9–3.7)
Potassium: 4.2 mmol/L (ref 3.5–5.3)
Sodium: 139 mmol/L (ref 135–146)
TOTAL PROTEIN: 7.1 g/dL (ref 6.1–8.1)
Total Bilirubin: 0.7 mg/dL (ref 0.2–1.2)

## 2018-01-19 LAB — CBC WITH DIFFERENTIAL/PLATELET
BASOS ABS: 41 {cells}/uL (ref 0–200)
Basophils Relative: 0.7 %
EOS PCT: 2.6 %
Eosinophils Absolute: 151 cells/uL (ref 15–500)
HCT: 37.8 % (ref 35.0–45.0)
HEMOGLOBIN: 12 g/dL (ref 11.7–15.5)
Lymphs Abs: 1804 cells/uL (ref 850–3900)
MCH: 29.3 pg (ref 27.0–33.0)
MCHC: 31.7 g/dL — AB (ref 32.0–36.0)
MCV: 92.4 fL (ref 80.0–100.0)
MONOS PCT: 8.3 %
MPV: 11 fL (ref 7.5–12.5)
NEUTROS ABS: 3323 {cells}/uL (ref 1500–7800)
Neutrophils Relative %: 57.3 %
Platelets: 226 10*3/uL (ref 140–400)
RBC: 4.09 10*6/uL (ref 3.80–5.10)
RDW: 11.7 % (ref 11.0–15.0)
Total Lymphocyte: 31.1 %
WBC mixed population: 481 cells/uL (ref 200–950)
WBC: 5.8 10*3/uL (ref 3.8–10.8)

## 2018-01-19 LAB — LIPID PANEL
CHOL/HDL RATIO: 2.7 (calc) (ref <5.0)
CHOLESTEROL: 132 mg/dL (ref <200)
HDL: 49 mg/dL — AB (ref >50)
LDL Cholesterol (Calc): 70 mg/dL (calc)
Non-HDL Cholesterol (Calc): 83 mg/dL (calc) (ref <130)
Triglycerides: 58 mg/dL (ref <150)

## 2018-01-19 LAB — HEMOGLOBIN A1C
EAG (MMOL/L): 8.7 (calc)
HEMOGLOBIN A1C: 7.1 %{Hb} — AB (ref <5.7)
Mean Plasma Glucose: 157 (calc)

## 2018-01-20 ENCOUNTER — Ambulatory Visit: Payer: Medicare Other | Admitting: Nurse Practitioner

## 2018-01-25 ENCOUNTER — Ambulatory Visit (INDEPENDENT_AMBULATORY_CARE_PROVIDER_SITE_OTHER): Payer: Medicare Other | Admitting: Nurse Practitioner

## 2018-01-25 ENCOUNTER — Encounter: Payer: Self-pay | Admitting: Nurse Practitioner

## 2018-01-25 VITALS — BP 138/74 | HR 73 | Temp 98.7°F | Ht 59.0 in | Wt 184.4 lb

## 2018-01-25 DIAGNOSIS — Z1231 Encounter for screening mammogram for malignant neoplasm of breast: Secondary | ICD-10-CM

## 2018-01-25 DIAGNOSIS — Z6837 Body mass index (BMI) 37.0-37.9, adult: Secondary | ICD-10-CM | POA: Diagnosis not present

## 2018-01-25 DIAGNOSIS — E119 Type 2 diabetes mellitus without complications: Secondary | ICD-10-CM | POA: Insufficient documentation

## 2018-01-25 DIAGNOSIS — E2839 Other primary ovarian failure: Secondary | ICD-10-CM

## 2018-01-25 DIAGNOSIS — I10 Essential (primary) hypertension: Secondary | ICD-10-CM

## 2018-01-25 DIAGNOSIS — Z23 Encounter for immunization: Secondary | ICD-10-CM | POA: Diagnosis not present

## 2018-01-25 DIAGNOSIS — M170 Bilateral primary osteoarthritis of knee: Secondary | ICD-10-CM | POA: Diagnosis not present

## 2018-01-25 DIAGNOSIS — E785 Hyperlipidemia, unspecified: Secondary | ICD-10-CM

## 2018-01-25 DIAGNOSIS — E66812 Obesity, class 2: Secondary | ICD-10-CM

## 2018-01-25 DIAGNOSIS — M8580 Other specified disorders of bone density and structure, unspecified site: Secondary | ICD-10-CM

## 2018-01-25 MED ORDER — ATENOLOL 25 MG PO TABS: 25.0000 mg | ORAL_TABLET | Freq: Every day | ORAL | 1 refills | Status: DC

## 2018-01-25 MED ORDER — LOSARTAN POTASSIUM 100 MG PO TABS: 100.0000 mg | ORAL_TABLET | Freq: Every day | ORAL | 1 refills | Status: DC

## 2018-01-25 MED ORDER — ROSUVASTATIN CALCIUM 10 MG PO TABS: 10.0000 mg | ORAL_TABLET | Freq: Every day | ORAL | 1 refills | Status: DC

## 2018-01-25 MED ORDER — HYDROCHLOROTHIAZIDE 12.5 MG PO CAPS: 12.5000 mg | ORAL_CAPSULE | Freq: Every day | ORAL | 1 refills | Status: DC

## 2018-01-25 NOTE — Progress Notes (Signed)
Careteam: Patient Care Team: Lauree Chandler, NP as PCP - General (Geriatric Medicine)  Advanced Directive information Does Patient Have a Medical Advance Directive?: No  No Known Allergies  Chief Complaint  Patient presents with  . Medical Management of Chronic Issues    Pt is being seen for a 6 month routine visit. Labs printed.   . Medication Refill    rx pended  . Audit C Screening    score of 0     HPI: Patient is a 72 y.o. female seen in the office today for routine follow up.   Over due for colorectal screening, cologuard was sent but never did. Son would like this resent and he will keep an eye out for it to make sure it is done.   Walking more, more active in the house. Continuing to modify diet.  Weight down from 197 lb to 184 lbs.   Due for mammogram and bone density  Osteopenia- remains on cal and vit d, walking more.  DM- saw ophthalmologist for diabetic eye exam at the end of august. A1c improved to 7.1 with diet. not on medication at this time.  htn- controlled on atenolol, hctz and losartan  Hyperlipidemia- LDL 70, continues on crestor with dietary modifications.   OA- improved with knee injections which she got in January 2019, some return of symptoms, using ice and muscle rub as needed. Would like to schedule for repeat injections.   Review of Systems:  Review of Systems  Constitutional: Negative for chills, fever and weight loss.  HENT: Negative for hearing loss.   Respiratory: Negative for cough, sputum production and shortness of breath.   Cardiovascular: Negative for chest pain, palpitations and leg swelling.  Gastrointestinal: Negative for abdominal pain, constipation, diarrhea and heartburn.  Genitourinary: Negative for dysuria and frequency.  Musculoskeletal: Positive for joint pain. Negative for back pain, falls and myalgias.  Skin: Negative.   Neurological: Negative for dizziness and headaches.  Psychiatric/Behavioral: Negative  for depression and memory loss. The patient does not have insomnia.     Past Medical History:  Diagnosis Date  . Arthritis   . High blood pressure    Past Surgical History:  Procedure Laterality Date  . ABDOMINAL HYSTERECTOMY  2013   due to cancer in uterus   . CATARACT EXTRACTION W/ INTRAOCULAR LENS  IMPLANT, BILATERAL  2014  . PARTIAL NEPHRECTOMY  2014   Right upper, mass   Social History:   reports that she has never smoked. She has never used smokeless tobacco. She reports that she does not drink alcohol or use drugs.  Family History  Problem Relation Age of Onset  . Arthritis Mother   . Arthritis Father     Medications: Patient's Medications  New Prescriptions   No medications on file  Previous Medications   ATENOLOL (TENORMIN) 25 MG TABLET    TAKE 1 TABLET BY MOUTH EVERY DAY FOR BLOOD PRESSURE   BLOOD GLUCOSE MONITORING SUPPL (ONE TOUCH ULTRA 2) W/DEVICE KIT    Use to check blood sugars one time daily. Dx:E11.9   CALCIUM CARBONATE-VITAMIN D (CALTRATE 600+D) 600-400 MG-UNIT TABLET    Take 1 tablet by mouth 2 (two) times daily.   GLUCOSE BLOOD TEST STRIP    Use to check blood sugars one time daily. Dx: E11.9   HYDROCHLOROTHIAZIDE (MICROZIDE) 12.5 MG CAPSULE    TAKE 1 CAPSULE (12.5 MG TOTAL) BY MOUTH DAILY. FOR BLOOD PRESSURE   LOSARTAN (COZAAR) 100 MG TABLET  TAKE 1 TABLET (100 MG TOTAL) BY MOUTH DAILY. FOR BLOOD PRESSURE   ONE TOUCH LANCETS MISC    Use to check blood sugars one time daily. Dx: E11.9   ROSUVASTATIN (CRESTOR) 10 MG TABLET    TAKE 1 TABLET BY MOUTH EVERY DAY  Modified Medications   No medications on file  Discontinued Medications   No medications on file     Physical Exam:  Vitals:   01/25/18 0845  BP: 138/74  Pulse: 73  Temp: 98.7 F (37.1 C)  TempSrc: Oral  SpO2: 98%  Weight: 184 lb 6.4 oz (83.6 kg)  Height: 4' 11"  (1.499 m)   Body mass index is 37.24 kg/m.  Physical Exam  Constitutional: She is oriented to person, place, and time.  She appears well-developed and well-nourished. No distress.  HENT:  Head: Normocephalic and atraumatic.  Mouth/Throat: No oropharyngeal exudate.  Eyes: Pupils are equal, round, and reactive to light. Conjunctivae are normal.  Neck: Normal range of motion. Neck supple.  Cardiovascular: Normal rate, regular rhythm and normal heart sounds.  Pulmonary/Chest: Effort normal and breath sounds normal.  Abdominal: Soft. Bowel sounds are normal. She exhibits no distension. There is no tenderness.  Obese abdomen   Musculoskeletal: She exhibits no edema or tenderness.  Neurological: She is alert and oriented to person, place, and time. She has normal reflexes.  Skin: Skin is warm and dry. She is not diaphoretic.  Psychiatric: She has a normal mood and affect.   Labs reviewed: Basic Metabolic Panel: Recent Labs    04/02/17 1148 06/29/17 0836 01/18/18 0916  NA 139 140 139  K 4.5 4.5 4.2  CL 99 101 101  CO2 34* 34* 33*  GLUCOSE 117* 140* 129*  BUN 12 19 14   CREATININE 0.80 0.88 0.75  CALCIUM 10.0 9.7 9.8   Liver Function Tests: Recent Labs    04/02/17 1148 06/29/17 0836 01/18/18 0916  AST 16 15 16   ALT 12 12 11   BILITOT 0.7 0.7 0.7  PROT 7.1 6.6 7.1   No results for input(s): LIPASE, AMYLASE in the last 8760 hours. No results for input(s): AMMONIA in the last 8760 hours. CBC: Recent Labs    04/02/17 1148 01/18/18 0916  WBC 5.3 5.8  NEUTROABS 2,608 3,323  HGB 13.0 12.0  HCT 39.0 37.8  MCV 89.9 92.4  PLT 239 226   Lipid Panel: Recent Labs    04/02/17 1148 06/29/17 0836 01/18/18 0916  CHOL 221* 129 132  HDL 60 45* 49*  LDLCALC 144* 71 70  TRIG 73 52 58  CHOLHDL 3.7 2.9 2.7   TSH: No results for input(s): TSH in the last 8760 hours. A1C: Lab Results  Component Value Date   HGBA1C 7.1 (H) 01/18/2018     Assessment/Plan 1. Essential hypertension Controlled on current regimen with dietary modifications.  - atenolol (TENORMIN) 25 MG tablet; Take 1 tablet (25  mg total) by mouth daily.  Dispense: 90 tablet; Refill: 1 - hydrochlorothiazide (MICROZIDE) 12.5 MG capsule; Take 1 capsule (12.5 mg total) by mouth daily. for blood pressure  Dispense: 90 capsule; Refill: 1 - losartan (COZAAR) 100 MG tablet; Take 1 tablet (100 mg total) by mouth daily. for blood pressure  Dispense: 90 tablet; Refill: 1  2. Hyperlipidemia, unspecified hyperlipidemia type -LDL improved, now at 70, goal <70. Continues on crestor.  - rosuvastatin (CRESTOR) 10 MG tablet; Take 1 tablet (10 mg total) by mouth daily.  Dispense: 90 tablet; Refill: 1  3. Osteopenia, unspecified location continues  on calcium and vit d with weight bearing activity.   4. Type 2 diabetes mellitus without complication, without long-term current use of insulin (HCC) -improved A1c with dietary restrictions. To continue at this time.   5. Primary osteoarthritis of both knees Knee injections in January 2019 which have been very beneficial. Continues to ice and use muscle rubs.   6. Estrogen deficiency - DG Bone Density; Future  7. Breast cancer screening by mammogram - MM DIGITAL SCREENING BILATERAL; Future  8. Need for influenza vaccination - Flu vaccine HIGH DOSE PF (Fluzone High dose)  9. Body mass index (BMI) of 37.0-37.9 in adult -noted today  10. Class 2 severe obesity due to excess calories with serious comorbidity and body mass index (BMI) of 37.0 to 37.9 in adult [facility]) -discussed continuing diet and exercise modifications, pt has had weight loss noted due to modifications.   Next appt:  Carlos American. [place], [facility] [phone]

## 2018-01-25 NOTE — Patient Instructions (Signed)
Please make appt with Dr Renato Gails for bilateral knee injection (pt preference)    Follow up in 6 months with [name] for routine visit. Please obtain lab work before appt.

## 2018-02-01 ENCOUNTER — Encounter: Payer: Self-pay | Admitting: Internal Medicine

## 2018-02-01 ENCOUNTER — Ambulatory Visit (INDEPENDENT_AMBULATORY_CARE_PROVIDER_SITE_OTHER): Payer: Medicare Other | Admitting: Internal Medicine

## 2018-02-01 VITALS — BP 148/80 | HR 92 | Temp 97.9°F | Ht 59.0 in | Wt 181.0 lb

## 2018-02-01 DIAGNOSIS — M170 Bilateral primary osteoarthritis of knee: Secondary | ICD-10-CM | POA: Diagnosis not present

## 2018-02-01 MED ORDER — METHYLPREDNISOLONE ACETATE 40 MG/ML IJ SUSP
40.0000 mg | Freq: Once | INTRAMUSCULAR | Status: AC
  Administered 2018-02-01: 40 mg via INTRA_ARTICULAR

## 2018-02-01 NOTE — Progress Notes (Signed)
Location:  [facility] clinic Provider:  Amaad Byers L. Mariea Clonts, D.O., C.M.D.  Goals of Care:  Advanced Directives 01/25/2018  Does Patient Have a Medical Advance Directive? No  Would patient like information on creating a medical advance directive? -   Chief Complaint  Patient presents with  . Follow-up    bilateral knee injections    HPI: Patient is a 72 y.o. female seen today for bilateral cortisone injections for bilateral knee osteoarthritis.  She has been using ice and tylenol.  She has done very well and her injections lasted 10 mos for her, but the knee pain has gradually started to get worse and she does not want to wait until it's as bad as it was before.  She is able to ambulate and do chores around the house.  She has lost 14 lbs as a result of improved mobility and diet.  She just saw the nutritionist also.    Pt was seen with her son who speaks english well, as well as the interpreter.  Knee injections were explained to patient and she expressed understanding and desire to pursue.  Risks, benefits, alternatives explained to pt and her son.    Past Medical History:  Diagnosis Date  . Arthritis   . High blood pressure     Past Surgical History:  Procedure Laterality Date  . ABDOMINAL HYSTERECTOMY  2013   due to cancer in uterus   . CATARACT EXTRACTION W/ INTRAOCULAR LENS  IMPLANT, BILATERAL  2014  . PARTIAL NEPHRECTOMY  2014   Right upper, mass    No Known Allergies  Outpatient Encounter Medications as of 02/01/2018  Medication Sig  . atenolol (TENORMIN) 25 MG tablet Take 1 tablet (25 mg total) by mouth daily.  . Blood Glucose Monitoring Suppl (ONE TOUCH ULTRA 2) w/Device KIT Use to check blood sugars one time daily. Dx:E11.9  . Calcium Carbonate-Vitamin D (CALTRATE 600+D) 600-400 MG-UNIT tablet Take 1 tablet by mouth 2 (two) times daily.  Marland Kitchen glucose blood test strip Use to check blood sugars one time daily. Dx: E11.9  . hydrochlorothiazide (MICROZIDE) 12.5 MG capsule Take 1  capsule (12.5 mg total) by mouth daily. for blood pressure  . losartan (COZAAR) 100 MG tablet Take 1 tablet (100 mg total) by mouth daily. for blood pressure  . ONE TOUCH LANCETS MISC Use to check blood sugars one time daily. Dx: E11.9  . rosuvastatin (CRESTOR) 10 MG tablet Take 1 tablet (10 mg total) by mouth daily.   No facility-administered encounter medications on file as of 02/01/2018.     Review of Systems:  Review of Systems  Constitutional: Positive for weight loss. Negative for chills and fever.  Genitourinary: Negative for dysuria.  Musculoskeletal: Positive for joint pain. Negative for falls.  Psychiatric/Behavioral: Negative for depression.    Health Maintenance  Topic Date Due  . FOOT EXAM  03/25/1955  . OPHTHALMOLOGY EXAM  03/25/1955  . MAMMOGRAM  11/14/2017  . Hepatitis C Screening  04/02/2018 (Originally 18-Feb-1946)  . COLONOSCOPY  11/20/2023 (Originally 03/25/1995)  . HEMOGLOBIN A1C  07/20/2018  . TETANUS/TDAP  03/25/2023  . INFLUENZA VACCINE  Completed  . DEXA SCAN  Completed  . PNA vac Low Risk Adult  Completed    Physical Exam: Vitals:   02/01/18 0900  BP: (!) 148/80  Pulse: 92  Temp: 97.9 F (36.6 C)  TempSrc: Oral  SpO2: 96%  Weight: 181 lb (82.1 kg)  Height: 4' 11" (1.499 m)   Body mass index is 36.56  kg/m. Physical Exam  Constitutional: She is oriented to person, place, and time. She appears well-developed and well-nourished. No distress.  Musculoskeletal: Normal range of motion. She exhibits tenderness.  Medial knees bilaterally, moving slowly   Neurological: She is alert and oriented to person, place, and time.  Skin: Skin is warm and dry.  Psychiatric: She has a normal mood and affect.    Labs reviewed: Basic Metabolic Panel: Recent Labs    04/02/17 1148 06/29/17 0836 01/18/18 0916  NA 139 140 139  K 4.5 4.5 4.2  CL 99 101 101  CO2 34* 34* 33*  GLUCOSE 117* 140* 129*  BUN 12 19 14  CREATININE 0.80 0.88 0.75  CALCIUM 10.0 9.7  9.8   Liver Function Tests: Recent Labs    04/02/17 1148 06/29/17 0836 01/18/18 0916  AST 16 15 16  ALT 12 12 11  BILITOT 0.7 0.7 0.7  PROT 7.1 6.6 7.1   No results for input(s): LIPASE, AMYLASE in the last 8760 hours. No results for input(s): AMMONIA in the last 8760 hours. CBC: Recent Labs    04/02/17 1148 01/18/18 0916  WBC 5.3 5.8  NEUTROABS 2,608 3,323  HGB 13.0 12.0  HCT 39.0 37.8  MCV 89.9 92.4  PLT 239 226   Lipid Panel: Recent Labs    04/02/17 1148 06/29/17 0836 01/18/18 0916  CHOL 221* 129 132  HDL 60 45* 49*  LDLCALC 144* 71 70  TRIG 73 52 58  CHOLHDL 3.7 2.9 2.7   Lab Results  Component Value Date   HGBA1C 7.1 (H) 01/18/2018   Assessment/Plan 1. Bilateral primary osteoarthritis of knee -Procedure note left knee injection verbal consent was obtained to inject left knee joint  Timeout was completed to confirm the site of injection  The medications used were 40 mg of Depo-Medrol and 1% lidocaine 3 cc  Anesthesia was provided by ethyl chloride and the skin was prepped with alcohol.  After cleaning the skin with alcohol a 20-gauge needle was used to inject the left knee joint. There were no complications. A sterile bandage was applied.   Procedure note right knee injection verbal consent was obtained to inject right knee joint  Timeout was completed to confirm the site of injection  The medications used were 40 mg of Depo-Medrol and 1% lidocaine 3 cc  Anesthesia was provided by ethyl chloride and the skin was prepped with alcohol.  After cleaning the skin with alcohol a 20-gauge needle was used to inject the right knee joint. There were no complications. A sterile bandage was applied.   Labs/tests ordered:  No new Next appt:  6 mos for med mgt with [name]  Tiffany L. Reed, D.O. Geriatrics [facility] [address] [place], [place] [address] Cell Phone (Mon-Fri 8am-5pm):  [phone] On Call:   [phone] & follow prompts after 5pm & weekends Office Phone:  [phone] Office Fax:  [phone]   

## 2018-02-01 NOTE — Patient Instructions (Signed)
Knee Injection, Care After  Refer to this sheet in the next few weeks. These instructions provide you with information about caring for yourself after your procedure. Your health care provider may also give you more specific instructions. Your treatment has been planned according to current medical practices, but problems sometimes occur. Call your health care provider if you have any problems or questions after your procedure.  What can I expect after the procedure?  After the procedure, it is common to have:   Soreness.   Warmth.   Swelling.    You may have more pain, swelling, and warmth than you did before the injection. This reaction may last for about one day.  Follow these instructions at home:  Bathing   If you were given a bandage (dressing), keep it dry until your health care provider says it can be removed. Ask your health care provider when you can start showering or taking a bath.  Managing pain, stiffness, and swelling   If directed, apply ice to the injection area:  ? Put ice in a plastic bag.  ? Place a towel between your skin and the bag.  ? Leave the ice on for 20 minutes, 2-3 times per day.   Do not apply heat to your knee.   Raise the injection area above the level of your heart while you are sitting or lying down.  Activity   Avoid strenuous activities for as long as directed by your health care provider. Ask your health care provider when you can return to your normal activities.  General instructions   Take medicines only as directed by your health care provider.   Do not take aspirin or other over-the-counter medicines unless your health care provider says you can.   Check your injection site every day for signs of infection. Watch for:  ? Redness, swelling, or pain.  ? Fluid, blood, or pus.   Follow your health care provider's instructions about dressing changes and removal.  Contact a health care provider if:   You have symptoms at your injection site that last longer than  two days after your procedure.   You have redness, swelling, or pain in your injection area.   You have fluid, blood, or pus coming from your injection site.   You have warmth in your injection area.   You have a fever.   Your pain is not controlled with medicine.  Get help right away if:   Your knee turns very red.   Your knee becomes very swollen.   Your knee pain is severe.  This information is not intended to replace advice given to you by your health care provider. Make sure you discuss any questions you have with your health care provider.  Document Released: 03/31/2014 Document Revised: 11/14/2015 Document Reviewed: 01/18/2014  Elsevier Interactive Patient Education  2018 [employer]

## 2018-02-01 NOTE — Addendum Note (Signed)
Addended by: Sueanne Margarita on: 02/01/2018 09:59 AM   Modules accepted: Orders

## 2018-06-30 ENCOUNTER — Encounter: Payer: Medicare Other | Admitting: Nurse Practitioner

## 2018-06-30 ENCOUNTER — Ambulatory Visit: Payer: Medicare Other

## 2018-07-01 ENCOUNTER — Ambulatory Visit (INDEPENDENT_AMBULATORY_CARE_PROVIDER_SITE_OTHER): Payer: Medicare Other | Admitting: Nurse Practitioner

## 2018-07-01 ENCOUNTER — Other Ambulatory Visit: Payer: Self-pay

## 2018-07-01 ENCOUNTER — Encounter: Payer: Self-pay | Admitting: Nurse Practitioner

## 2018-07-01 DIAGNOSIS — Z0000 Encounter for general adult medical examination without abnormal findings: Secondary | ICD-10-CM | POA: Diagnosis not present

## 2018-07-01 NOTE — Patient Instructions (Addendum)
Kerri Chan , Thank you for taking time to come for your Medicare Wellness Visit. I appreciate your ongoing commitment to your health goals. Please review the following plan we discussed and let me know if I can assist you in the future.   Screening recommendations/referrals: Colonoscopy: would like cologuard, has not been delivered.  Mammogram ordered, needs to schedule- please call to set this up at [facility] Bone Density has been ordered, needs to schedule-please call to set this up at [facility] Recommended yearly ophthalmology/optometry visit for glaucoma screening and checkup Recommended yearly dental visit for hygiene and checkup  Vaccinations: Influenza vaccine up to date  Pneumococcal vaccine up to date Tdap vaccine up to date Shingles vaccine: Rx Sent to pharmacy  Advanced directives: you have declined to complete these.  Conditions/risks identified: some memory loss noted on screening, will continue to monitor this  Next appointment: 1 year for AWV   Preventive Care 65 Years and Older, Female Preventive care refers to lifestyle choices and visits with your health care provider that can promote health and wellness. What does preventive care include?  A yearly physical exam. This is also called an annual well check.  Dental exams once or twice a year.  Routine eye exams. Ask your health care provider how often you should have your eyes checked.  Personal lifestyle choices, including:  Daily care of your teeth and gums.  Regular physical activity.  Eating a healthy diet.  Avoiding tobacco and drug use.  Limiting alcohol use.  Practicing safe sex.  Taking low-dose aspirin every day.  Taking vitamin and mineral supplements as recommended by your health care provider. What happens during an annual well check? The services and screenings done by your health care provider during your annual well check will depend on your  age, overall health, lifestyle risk factors, and family history of disease. Counseling  Your health care provider may ask you questions about your:  Alcohol use.  Tobacco use.  Drug use.  Emotional well-being.  Home and relationship well-being.  Sexual activity.  Eating habits.  History of falls.  Memory and ability to understand (cognition).  Work and work Astronomer.  Reproductive health. Screening  You may have the following tests or measurements:  Height, weight, and BMI.  Blood pressure.  Lipid and cholesterol levels. These may be checked every 5 years, or more frequently if you are over 86 years old.  Skin check.  Lung cancer screening. You may have this screening every year starting at age 74 if you have a 30-pack-year history of smoking and currently smoke or have quit within the past 15 years.  Fecal occult blood test (FOBT) of the stool. You may have this test every year starting at age 37.  Flexible sigmoidoscopy or colonoscopy. You may have a sigmoidoscopy every 5 years or a colonoscopy every 10 years starting at age 52.  Hepatitis C blood test.  Hepatitis B blood test.  Sexually transmitted disease (STD) testing.  Diabetes screening. This is done by checking your blood sugar (glucose) after you have not eaten for a while (fasting). You may have this done every 1-3 years.  Bone density scan. This is done to screen for osteoporosis. You may have this done starting at age 60.  Mammogram. This may be done every 1-2 years. Talk to your health care provider about how often you should have regular mammograms. Talk with your health care provider about your test results, treatment  options, and if necessary, the need for more tests. Vaccines  Your health care provider may recommend certain vaccines, such as:  Influenza vaccine. This is recommended every year.  Tetanus, diphtheria, and acellular pertussis (Tdap, Td) vaccine. You may need a Td booster  every 10 years.  Zoster vaccine. You may need this after age 90.  Pneumococcal 13-valent conjugate (PCV13) vaccine. One dose is recommended after age 12.  Pneumococcal polysaccharide (PPSV23) vaccine. One dose is recommended after age 50. Talk to your health care provider about which screenings and vaccines you need and how often you need them. This information is not intended to replace advice given to you by your health care provider. Make sure you discuss any questions you have with your health care provider. Document Released: 04/06/2015 Document Revised: 11/28/2015 Document Reviewed: 01/09/2015 Elsevier Interactive Patient Education  2017 [place] Prevention in the Home Falls can cause injuries. They can happen to people of all ages. There are many things you can do to make your home safe and to help prevent falls. What can I do on the outside of my home?  Regularly fix the edges of walkways and driveways and fix any cracks.  Remove anything that might make you trip as you walk through a door, such as a raised step or threshold.  Trim any bushes or trees on the path to your home.  Use bright outdoor lighting.  Clear any walking paths of anything that might make someone trip, such as rocks or tools.  Regularly check to see if handrails are loose or broken. Make sure that both sides of any steps have handrails.  Any raised decks and porches should have guardrails on the edges.  Have any leaves, snow, or ice cleared regularly.  Use sand or salt on walking paths during winter.  Clean up any spills in your garage right away. This includes oil or grease spills. What can I do in the bathroom?  Use night lights.  Install grab bars by the toilet and in the tub and shower. Do not use towel bars as grab bars.  Use non-skid mats or decals in the tub or shower.  If you need to sit down in the shower, use a plastic, non-slip stool.  Keep the floor dry. Clean up any  water that spills on the floor as soon as it happens.  Remove soap buildup in the tub or shower regularly.  Attach bath mats securely with double-sided non-slip rug tape.  Do not have throw rugs and other things on the floor that can make you trip. What can I do in the bedroom?  Use night lights.  Make sure that you have a light by your bed that is easy to reach.  Do not use any sheets or blankets that are too big for your bed. They should not hang down onto the floor.  Have a firm chair that has side arms. You can use this for support while you get dressed.  Do not have throw rugs and other things on the floor that can make you trip. What can I do in the kitchen?  Clean up any spills right away.  Avoid walking on wet floors.  Keep items that you use a lot in easy-to-reach places.  If you need to reach something above you, use a strong step stool that has a grab bar.  Keep electrical cords out of the way.  Do not use floor polish or wax that makes floors slippery.  If you must use wax, use non-skid floor wax.  Do not have throw rugs and other things on the floor that can make you trip. What can I do with my stairs?  Do not leave any items on the stairs.  Make sure that there are handrails on both sides of the stairs and use them. Fix handrails that are broken or loose. Make sure that handrails are as long as the stairways.  Check any carpeting to make sure that it is firmly attached to the stairs. Fix any carpet that is loose or worn.  Avoid having throw rugs at the top or bottom of the stairs. If you do have throw rugs, attach them to the floor with carpet tape.  Make sure that you have a light switch at the top of the stairs and the bottom of the stairs. If you do not have them, ask someone to add them for you. What else can I do to help prevent falls?  Wear shoes that:  Do not have high heels.  Have rubber bottoms.  Are comfortable and fit you well.  Are closed  at the toe. Do not wear sandals.  If you use a stepladder:  Make sure that it is fully opened. Do not climb a closed stepladder.  Make sure that both sides of the stepladder are locked into place.  Ask someone to hold it for you, if possible.  Clearly mark and make sure that you can see:  Any grab bars or handrails.  First and last steps.  Where the edge of each step is.  Use tools that help you move around (mobility aids) if they are needed. These include:  Canes.  Walkers.  Scooters.  Crutches.  Turn on the lights when you go into a dark area. Replace any light bulbs as soon as they burn out.  Set up your furniture so you have a clear path. Avoid moving your furniture around.  If any of your floors are uneven, fix them.  If there are any pets around you, be aware of where they are.  Review your medicines with your doctor. Some medicines can make you feel dizzy. This can increase your chance of falling. Ask your doctor what other things that you can do to help prevent falls. This information is not intended to replace advice given to you by your health care provider. Make sure you discuss any questions you have with your health care provider. Document Released: 01/04/2009 Document Revised: 08/16/2015 Document Reviewed: 04/14/2014 Elsevier Interactive Patient Education  2017 [employer].

## 2018-07-01 NOTE — Progress Notes (Signed)
This service is provided via telemedicine  No vital signs collected/recorded due to the encounter was a telemedicine visit.   Location of patient (ex: home, work):  Home   Patient consents to a telephone visit: Yes  Location of the provider (ex: office, home):  [employer], Office   Names of all persons participating in the telemedicine service and their role in the encounter:  S.Chrae B/CMA, Abbey Chatters, NP, Son, interpreter(Roshan) and Patient   Time spent on call: 2 min with medical assistant

## 2018-07-01 NOTE — Progress Notes (Signed)
 Subjective:   Kerri Chan is a 73 y.o. female who presents for Medicare Annual (Subsequent) preventive examination.  Review of Systems:   Cardiac Risk Factors include: advanced age (>55men, >65 women);diabetes mellitus;dyslipidemia;obesity (BMI >30kg/m2);sedentary lifestyle     Objective:     Vitals: There were no vitals taken for this visit.  There is no height or weight on file to calculate BMI.  Advanced Directives 07/01/2018 01/25/2018 06/29/2017 03/10/2016 12/10/2015 10/29/2015 10/04/2015  Does Patient Have a Medical Advance Directive? No No No No No - No  Would patient like information on creating a medical advance directive? No - Patient declined - Yes (MAU/Ambulatory/Procedural Areas - Information given) Yes (MAU/Ambulatory/Procedural Areas - Information given) No - patient declined information No - patient declined information No - patient declined information    Tobacco Social History   Tobacco Use  Smoking Status Never Smoker  Smokeless Tobacco Never Used     Counseling given: Not Answered   Clinical Intake:  Pre-visit preparation completed: Yes  Pain : No/denies pain     BMI - recorded: 36.56 Nutritional Status: BMI > 30  Obese Nutritional Risks: None Diabetes: Yes CBG done?: No Did pt. bring in CBG monitor from home?: No  How often do you need to have someone help you when you read instructions, pamphlets, or other written materials from your doctor or pharmacy?: 3 - Sometimes(based on language, language barrier) What is the last grade level you completed in school?: 6th grade  Interpreter Needed?: Yes Patient Declined Interpreter : Yes Interpretation services provided by: Family member interpreted visit     Past Medical History:  Diagnosis Date  . Arthritis   . High blood pressure    Past Surgical History:  Procedure Laterality Date  . ABDOMINAL HYSTERECTOMY  2013   due to cancer in uterus   . CATARACT EXTRACTION W/ INTRAOCULAR LENS  IMPLANT,  BILATERAL  2014  . PARTIAL NEPHRECTOMY  2014   Right upper, mass   Family History  Problem Relation Age of Onset  . Arthritis Mother   . Arthritis Father    Social History   Socioeconomic History  . Marital status: Married    Spouse name: Not on file  . Number of children: Not on file  . Years of education: Not on file  . Highest education level: Not on file  Occupational History  . Not on file  Social Needs  . Financial resource strain: Not hard at all  . Food insecurity:    Worry: Never true    Inability: Never true  . Transportation needs:    Medical: No    Non-medical: No  Tobacco Use  . Smoking status: Never Smoker  . Smokeless tobacco: Never Used  Substance and Sexual Activity  . Alcohol use: No    Alcohol/week: 0.0 standard drinks  . Drug use: No  . Sexual activity: Not Currently  Lifestyle  . Physical activity:    Days per week: 4 days    Minutes per session: 20 min  . Stress: Only a little  Relationships  . Social connections:    Talks on phone: More than three times a week    Gets together: More than three times a week    Attends religious service: Never    Active member of club or organization: No    Attends meetings of clubs or organizations: Never    Relationship status: Married  Other Topics Concern  . Not on file  Social History Narrative     Diet? Healthy      Do you drink/eat things with caffeine? Black and Green Tea      Marital status?                   m                 What year were you married? 1968      Do you live in a house, apartment, assisted living, condo, trailer, etc.? apartment      Is it one or more stories? 2 story      How many persons live in your home? 3 (husband and son)      Do you have any pets in your home? (please list) 1- a parrot (small bird)      Current or past profession: -      Do you exercise?      walk                                Type & how often? light      Do you have a living will? no      Do  you have a DNR form?      no                            If not, do you want to discuss one? N/A      Do you have signed POA/HPOA for forms? N/A       Outpatient Encounter Medications as of 07/01/2018  Medication Sig  . atenolol (TENORMIN) 25 MG tablet Take 1 tablet (25 mg total) by mouth daily.  . Blood Glucose Monitoring Suppl (ONE TOUCH ULTRA 2) w/Device KIT Use to check blood sugars one time daily. Dx:E11.9  . Calcium Carbonate-Vitamin D (CALTRATE 600+D) 600-400 MG-UNIT tablet Take 1 tablet by mouth 2 (two) times daily.  Marland Kitchen glucose blood test strip Use to check blood sugars one time daily. Dx: E11.9  . hydrochlorothiazide (MICROZIDE) 12.5 MG capsule Take 1 capsule (12.5 mg total) by mouth daily. for blood pressure  . losartan (COZAAR) 100 MG tablet Take 1 tablet (100 mg total) by mouth daily. for blood pressure  . ONE TOUCH LANCETS MISC Use to check blood sugars one time daily. Dx: E11.9  . Polyethyl Glycol-Propyl Glycol (SYSTANE) 0.4-0.3 % SOLN Apply to eye as needed.  . rosuvastatin (CRESTOR) 10 MG tablet Take 1 tablet (10 mg total) by mouth daily.   No facility-administered encounter medications on file as of 07/01/2018.     Activities of Daily Living In your present state of health, do you have any difficulty performing the following activities: 07/01/2018  Hearing? N  Vision? N  Difficulty concentrating or making decisions? N  Walking or climbing stairs? N  Dressing or bathing? N  Doing errands, shopping? Y  Comment language Adult nurse and eating ? N  Using the Toilet? N  In the past six months, have you accidently leaked urine? N  Do you have problems with loss of bowel control? N  Managing your Medications? N  Managing your Finances? N  Comment son Engineer, production or managing your Housekeeping? N  Some recent data might be hidden    Patient Care Team: Lauree Chandler, NP as PCP - General (Geriatric Medicine)    Assessment:   This is a routine  wellness examination for [name].  Exercise Activities and Dietary recommendations Current Exercise Habits: Home exercise routine, Type of exercise: walking;calisthenics, Time (Minutes): 30, Frequency (Times/Week): 4, Weekly Exercise (Minutes/Week): 120, Intensity: Mild, Exercise limited by: orthopedic condition(s)  Goals    . Weight (lb) < 200 lb (90.7 kg)       Fall Risk Fall Risk  07/01/2018 02/01/2018 01/25/2018 07/06/2017 06/29/2017  Falls in the past year? 0 0 0 No No  Number falls in past yr: 0 0 - - -  Injury with Fall? 0 0 - - -   Is the patient's home free of loose throw rugs in walkways, pet beds, electrical cords, etc?   yes      Grab bars in the bathroom? yes      Handrails on the stairs?   yes      Adequate lighting?   yes  Timed Get Up and Go performed: na  Depression Screen PHQ 2/9 Scores 07/01/2018 02/01/2018 06/29/2017 04/23/2017  PHQ - 2 Score 0 0 0 0     Cognitive Function MMSE - Mini Mental State Exam 06/29/2017 10/29/2015  Not completed: Unable to complete -  Orientation to time 3 4  Orientation to Place 2 3  Registration 3 3  Attention/ Calculation 0 5  Recall 3 3  Language- name 2 objects 2 2  Language- repeat 1 1  Language- follow 3 step command 3 3  Language- read & follow direction 1 1  Write a sentence 1 1  Copy design 1 1  Total score 20 27     6CIT Screen 07/01/2018  What Year? 0 points  What month? 0 points  What time? 0 points  Count back from 20 0 points  Months in reverse 0 points  Repeat phrase 4 points  Total Score 4    Immunization History  Administered Date(s) Administered  . Influenza, High Dose Seasonal PF 01/25/2018  . Influenza,inj,Quad PF,6+ Mos 03/10/2016  . Influenza-Unspecified 12/22/2013  . Pneumococcal Conjugate-13 04/02/2017  . Pneumococcal-Unspecified 12/22/2013  . Tdap 03/24/2013    Qualifies for Shingles Vaccine?yes  Screening Tests Health Maintenance  Topic Date Due  . Hepatitis C Screening  12/28/1945  . FOOT  EXAM  03/25/1955  . OPHTHALMOLOGY EXAM  03/25/1955  . MAMMOGRAM  11/14/2017  . COLONOSCOPY  11/20/2023 (Originally 03/25/1995)  . HEMOGLOBIN A1C  07/20/2018  . INFLUENZA VACCINE  10/23/2018  . TETANUS/TDAP  03/25/2023  . DEXA SCAN  Completed  . PNA vac Low Risk Adult  Completed    Cancer Screenings: Lung: Low Dose CT Chest recommended if Age 55-80 years, 30 pack-year currently smoking OR have quit w/in 15years. Patient does not qualify. Breast:  Up to date on Mammogram? No   Up to date of Bone Density/Dexa? No Colorectal: over due, willing to do colo-guard  Additional Screenings:  Hepatitis C Screening: none     Plan:     I have personally reviewed and noted the following in the patient's chart:   . Medical and social history . Use of alcohol, tobacco or illicit drugs  . Current medications and supplements . Functional ability and status . Nutritional status . Physical activity . Advanced directives . List of other physicians . Hospitalizations, surgeries, and ER visits in previous 12 months . Vitals . Screenings to include cognitive, depression, and falls . Referrals and appointments  In addition, I have reviewed and discussed with patient certain preventive protocols, quality metrics, and best practice recommendations. A written personalized care   plan for preventive services as well as general preventive health recommendations were provided to patient.     Lauree Chandler, NP  07/01/2018

## 2018-08-05 DIAGNOSIS — Z1212 Encounter for screening for malignant neoplasm of rectum: Secondary | ICD-10-CM | POA: Diagnosis not present

## 2018-08-05 DIAGNOSIS — Z1211 Encounter for screening for malignant neoplasm of colon: Secondary | ICD-10-CM | POA: Diagnosis not present

## 2018-08-05 LAB — COLOGUARD: Cologuard: NEGATIVE

## 2018-08-11 ENCOUNTER — Encounter: Payer: Self-pay | Admitting: *Deleted

## 2018-08-17 ENCOUNTER — Ambulatory Visit: Payer: Medicare Other | Admitting: Nurse Practitioner

## 2018-08-18 ENCOUNTER — Other Ambulatory Visit: Payer: Self-pay

## 2018-08-18 ENCOUNTER — Encounter: Payer: Self-pay | Admitting: Nurse Practitioner

## 2018-08-18 ENCOUNTER — Ambulatory Visit (INDEPENDENT_AMBULATORY_CARE_PROVIDER_SITE_OTHER): Payer: Medicare Other | Admitting: Nurse Practitioner

## 2018-08-18 DIAGNOSIS — M170 Bilateral primary osteoarthritis of knee: Secondary | ICD-10-CM

## 2018-08-18 DIAGNOSIS — M8580 Other specified disorders of bone density and structure, unspecified site: Secondary | ICD-10-CM | POA: Diagnosis not present

## 2018-08-18 DIAGNOSIS — E785 Hyperlipidemia, unspecified: Secondary | ICD-10-CM

## 2018-08-18 DIAGNOSIS — I10 Essential (primary) hypertension: Secondary | ICD-10-CM | POA: Diagnosis not present

## 2018-08-18 DIAGNOSIS — E119 Type 2 diabetes mellitus without complications: Secondary | ICD-10-CM

## 2018-08-18 MED ORDER — LOSARTAN POTASSIUM 100 MG PO TABS: 100.0000 mg | ORAL_TABLET | Freq: Every day | ORAL | 1 refills | Status: DC

## 2018-08-18 MED ORDER — ATENOLOL 25 MG PO TABS: 25.0000 mg | ORAL_TABLET | Freq: Every day | ORAL | 1 refills | Status: DC

## 2018-08-18 MED ORDER — ROSUVASTATIN CALCIUM 10 MG PO TABS: 10.0000 mg | ORAL_TABLET | Freq: Every day | ORAL | 1 refills | Status: DC

## 2018-08-18 MED ORDER — HYDROCHLOROTHIAZIDE 12.5 MG PO CAPS: 12.5000 mg | ORAL_CAPSULE | Freq: Every day | ORAL | 1 refills | Status: DC

## 2018-08-18 NOTE — Patient Instructions (Addendum)
You are due to repeat bone density for osteopenia- this order has been placed. Please call [facility] to schedule.   Please sign record release so we can get ophthalmologist records to add to our file.

## 2018-08-18 NOTE — Progress Notes (Signed)
This service is provided via telemedicine  No vital signs collected/recorded due to the encounter was a telemedicine visit.   Location of patient (ex: home, work):  Home  Patient consents to a telephone visit:  Yes  Location of the provider (ex: office, home): [facility], Office   Name of any referring provider: N/A  Names of all persons participating in the telemedicine service and their role in the encounter:  S.Chrae B/CMA, [name], NP, Son Windle Guard), and Patient   Time spent on call:  7 min with medical assistant      Careteam: Patient Care Team: Lauree Chandler, NP as PCP - General (Geriatric Medicine)  Advanced Directive information Does Patient Have a Medical Advance Directive?: No, Would patient like information on creating a medical advance directive?: No - Patient declined  No Known Allergies  Chief Complaint  Patient presents with  . Medical Management of Chronic Issues    6 month follow-up. Telephone visit   . Best Practice Recommendations    Discuss need for Hep C screening   . Quality Metric Gaps    Discuss need for Ophthalmology exam   . Immunizations    Discuss need for Shingrix      HPI: Patient is a 73 y.o. female for routine follow up.  Doing well.  Did cologuard and was normal.  More mobile. Son her a walker with a seat and now she is walking further.   OA- last injection in November. Pain in minimal doing well. Does not need any medication as needed at this time.   Hypertension- 124/79 at home last week. Does diet modifications. Atenolol, hctz and losartan which keeps BP controlled.   Hyperlipidemia- continues on crestor, with diet modifications.   Diabetes- diet controlled, no medications. Blood sugars normal in the 90s fasting. Had eye exam 02/2018. Dr Sharen Counter in [place].   Review of Systems:  Review of Systems  Constitutional: Negative for chills, fever and weight loss.  HENT: Negative for hearing loss.    Respiratory: Negative for cough, sputum production and shortness of breath.   Cardiovascular: Negative for chest pain, palpitations and leg swelling.  Gastrointestinal: Negative for abdominal pain, constipation, diarrhea and heartburn.  Genitourinary: Negative for dysuria and frequency.  Musculoskeletal: Positive for joint pain (improved knee pain with injection in november 2019). Negative for back pain, falls and myalgias.  Skin: Negative.   Neurological: Negative for dizziness and headaches.  Psychiatric/Behavioral: Negative for depression and memory loss. The patient does not have insomnia.     Past Medical History:  Diagnosis Date  . Arthritis   . High blood pressure    Past Surgical History:  Procedure Laterality Date  . ABDOMINAL HYSTERECTOMY  2013   due to cancer in uterus   . CATARACT EXTRACTION W/ INTRAOCULAR LENS  IMPLANT, BILATERAL  2014  . PARTIAL NEPHRECTOMY  2014   Right upper, mass   Social History:   reports that she has never smoked. She has never used smokeless tobacco. She reports that she does not drink alcohol or use drugs.  Family History  Problem Relation Age of Onset  . Arthritis Mother   . Arthritis Father     Medications: Patient's Medications  New Prescriptions   No medications on file  Previous Medications   ASCORBIC ACID (VITAMIN C) 1000 MG TABLET    Take 1,000 mg by mouth 2 (two) times a week.   ATENOLOL (TENORMIN) 25 MG TABLET    Take 1  tablet (25 mg total) by mouth daily.   BLOOD GLUCOSE MONITORING SUPPL (ONE TOUCH ULTRA 2) W/DEVICE KIT    Use to check blood sugars one time daily. Dx:E11.9   CALCIUM CARBONATE-VITAMIN D (CALTRATE 600+D) 600-400 MG-UNIT TABLET    Take 1 tablet by mouth 2 (two) times daily.   GLUCOSE BLOOD TEST STRIP    Use to check blood sugars one time daily. Dx: E11.9   HYDROCHLOROTHIAZIDE (MICROZIDE) 12.5 MG CAPSULE    Take 1 capsule (12.5 mg total) by mouth daily. for blood pressure   LOSARTAN (COZAAR) 100 MG TABLET     Take 1 tablet (100 mg total) by mouth daily. for blood pressure   ONE TOUCH LANCETS MISC    Use to check blood sugars one time daily. Dx: E11.9   POLYETHYL GLYCOL-PROPYL GLYCOL (SYSTANE) 0.4-0.3 % SOLN    Apply to eye as needed.   ROSUVASTATIN (CRESTOR) 10 MG TABLET    Take 1 tablet (10 mg total) by mouth daily.  Modified Medications   No medications on file  Discontinued Medications   No medications on file     Physical Exam: unable due to televisit.     Labs reviewed: Basic Metabolic Panel: Recent Labs    01/18/18 0916  NA 139  K 4.2  CL 101  CO2 33*  GLUCOSE 129*  BUN 14  CREATININE 0.75  CALCIUM 9.8   Liver Function Tests: Recent Labs    01/18/18 0916  AST 16  ALT 11  BILITOT 0.7  PROT 7.1   No results for input(s): LIPASE, AMYLASE in the last 8760 hours. No results for input(s): AMMONIA in the last 8760 hours. CBC: Recent Labs    01/18/18 0916  WBC 5.8  NEUTROABS 3,323  HGB 12.0  HCT 37.8  MCV 92.4  PLT 226   Lipid Panel: Recent Labs    01/18/18 0916  CHOL 132  HDL 49*  LDLCALC 70  TRIG 58  CHOLHDL 2.7   TSH: No results for input(s): TSH in the last 8760 hours. A1C: Lab Results  Component Value Date   HGBA1C 7.1 (H) 01/18/2018     Assessment/Plan 1. Essential hypertension -controlled on medication and diet.  - hydrochlorothiazide (MICROZIDE) 12.5 MG capsule; Take 1 capsule (12.5 mg total) by mouth daily. for blood pressure  Dispense: 90 capsule; Refill: 1 - losartan (COZAAR) 100 MG tablet; Take 1 tablet (100 mg total) by mouth daily. for blood pressure  Dispense: 90 tablet; Refill: 1 - atenolol (TENORMIN) 25 MG tablet; Take 1 tablet (25 mg total) by mouth daily.  Dispense: 90 tablet; Refill: 1 - CBC with Differential/Platelet; Future  2. Hyperlipidemia, unspecified hyperlipidemia type -continues with Crestor and diet modifications.  - rosuvastatin (CRESTOR) 10 MG tablet; Take 1 tablet (10 mg total) by mouth daily.  Dispense: 90  tablet; Refill: 1 - Lipid Panel; Future - COMPLETE METABOLIC PANEL WITH GFR; Future  3. Type 2 diabetes mellitus without complication, without long-term current use of insulin (HCC) -diet controlled, fasting blood sugars have been at goal. - Hemoglobin A1c; Future  4. Primary osteoarthritis of both knees Doing well s/p knee injection in November of 2019  5. Osteopenia, unspecified location -continue on cal and vit d, due for follow up bone density. Orders have been placed, to call and schedule.   Next appt: 6 month follow up.  Carlos American. Harle Battiest  [facility] [phone]    Virtual Visit via Telephone Note  I connected  with pt on 08/18/18 at 12:00 PM EDT by telephone and verified that I am speaking with the correct person using two identifiers.  Location: Patient: home Provider: office   I discussed the limitations, risks, security and privacy concerns of performing an evaluation and management service by telephone and the availability of in person appointments. I also discussed with the patient that there may be a patient responsible charge related to this service. The patient expressed understanding and agreed to proceed.   I discussed the assessment and treatment plan with the patient. The patient was provided an opportunity to ask questions and all were answered. The patient agreed with the plan and demonstrated an understanding of the instructions.   The patient was advised to call back or seek an in-person evaluation if the symptoms worsen or if the condition fails to improve as anticipated.  I provided 12 minutes of non-face-to-face time during this encounter.  Carlos American. Harle Battiest Avs printed and mailed

## 2018-08-25 ENCOUNTER — Other Ambulatory Visit: Payer: Medicare Other

## 2018-08-25 ENCOUNTER — Other Ambulatory Visit: Payer: Self-pay

## 2018-08-25 DIAGNOSIS — E785 Hyperlipidemia, unspecified: Secondary | ICD-10-CM | POA: Diagnosis not present

## 2018-08-25 DIAGNOSIS — E119 Type 2 diabetes mellitus without complications: Secondary | ICD-10-CM

## 2018-08-25 DIAGNOSIS — I10 Essential (primary) hypertension: Secondary | ICD-10-CM

## 2018-08-26 ENCOUNTER — Other Ambulatory Visit: Payer: Self-pay | Admitting: Nurse Practitioner

## 2018-08-26 LAB — COMPLETE METABOLIC PANEL WITH GFR
AG Ratio: 1.4 (calc) (ref 1.0–2.5)
ALT: 11 U/L (ref 6–29)
AST: 17 U/L (ref 10–35)
Albumin: 4.2 g/dL (ref 3.6–5.1)
Alkaline phosphatase (APISO): 47 U/L (ref 37–153)
BUN: 18 mg/dL (ref 7–25)
CO2: 34 mmol/L — ABNORMAL HIGH (ref 20–32)
Calcium: 10.8 mg/dL — ABNORMAL HIGH (ref 8.6–10.4)
Chloride: 100 mmol/L (ref 98–110)
Creat: 0.91 mg/dL (ref 0.60–0.93)
GFR, Est African American: 73 mL/min/{1.73_m2} (ref >=60)
GFR, Est Non African American: 63 mL/min/{1.73_m2} (ref >=60)
Globulin: 2.9 g/dL (calc) (ref 1.9–3.7)
Glucose, Bld: 122 mg/dL — ABNORMAL HIGH (ref 65–99)
Potassium: 4.5 mmol/L (ref 3.5–5.3)
Sodium: 140 mmol/L (ref 135–146)
Total Bilirubin: 1 mg/dL (ref 0.2–1.2)
Total Protein: 7.1 g/dL (ref 6.1–8.1)

## 2018-08-26 LAB — CBC WITH DIFFERENTIAL/PLATELET
Absolute Monocytes: 476 cells/uL (ref 200–950)
Basophils Absolute: 39 cells/uL (ref 0–200)
Basophils Relative: 0.7 %
Eosinophils Absolute: 157 cells/uL (ref 15–500)
Eosinophils Relative: 2.8 %
HCT: 39.3 % (ref 35.0–45.0)
Hemoglobin: 13 g/dL (ref 11.7–15.5)
Lymphs Abs: 2240 cells/uL (ref 850–3900)
MCH: 30.4 pg (ref 27.0–33.0)
MCHC: 33.1 g/dL (ref 32.0–36.0)
MCV: 92 fL (ref 80.0–100.0)
MPV: 10.9 fL (ref 7.5–12.5)
Monocytes Relative: 8.5 %
Neutro Abs: 2688 cells/uL (ref 1500–7800)
Neutrophils Relative %: 48 %
Platelets: 210 10*3/uL (ref 140–400)
RBC: 4.27 10*6/uL (ref 3.80–5.10)
RDW: 11.8 % (ref 11.0–15.0)
Total Lymphocyte: 40 %
WBC: 5.6 10*3/uL (ref 3.8–10.8)

## 2018-08-26 LAB — LIPID PANEL
Cholesterol: 146 mg/dL (ref <200)
HDL: 60 mg/dL (ref >=50)
LDL Cholesterol (Calc): 74 mg/dL (calc)
Non-HDL Cholesterol (Calc): 86 mg/dL (calc) (ref <130)
Total CHOL/HDL Ratio: 2.4 (calc) (ref <5.0)
Triglycerides: 50 mg/dL (ref <150)

## 2018-08-26 LAB — HEMOGLOBIN A1C
Hgb A1c MFr Bld: 7 % of total Hgb — ABNORMAL HIGH (ref <5.7)
Mean Plasma Glucose: 154 (calc)
eAG (mmol/L): 8.5 (calc)

## 2018-09-03 ENCOUNTER — Telehealth: Payer: Self-pay | Admitting: *Deleted

## 2018-09-03 ENCOUNTER — Other Ambulatory Visit: Payer: Self-pay | Admitting: Nurse Practitioner

## 2018-09-03 MED ORDER — VALSARTAN 320 MG PO TABS: 320.0000 mg | ORAL_TABLET | Freq: Every day | ORAL | 1 refills | Status: DC

## 2018-09-03 NOTE — Telephone Encounter (Signed)
Spoke with patient's son and advised results.  

## 2018-09-03 NOTE — Telephone Encounter (Signed)
Fax from pharmacy stating that all strengths of losartan are on backorder and have been unable to obtain the last 3 months, please send in new rx for another ARB

## 2018-09-03 NOTE — Telephone Encounter (Signed)
Have sent in order for valsartan since losartan on backorder, please have pt take blood pressure at home to make sure blood pressure maintains in ideal range. <140/90

## 2018-09-03 NOTE — Progress Notes (Signed)
Will switch to valsartan since losartan is on backorder

## 2018-09-23 ENCOUNTER — Other Ambulatory Visit: Payer: Self-pay | Admitting: Nurse Practitioner

## 2018-09-23 DIAGNOSIS — Z1231 Encounter for screening mammogram for malignant neoplasm of breast: Secondary | ICD-10-CM

## 2018-09-29 ENCOUNTER — Telehealth: Payer: Self-pay | Admitting: *Deleted

## 2018-09-29 NOTE — Telephone Encounter (Signed)
I would like for her to have a mammogram and bone density and it appears both of these have been scheduled for August.

## 2018-09-29 NOTE — Telephone Encounter (Signed)
Patient son Kerri Chan called and wanted to know if you want patient to have a Bone Density and a Mammogram or one or the other. Needs referral placed if you want her to have Bone Density.  Please Advise.

## 2018-09-29 NOTE — Telephone Encounter (Signed)
Patient son notified and agreed.  ?

## 2018-10-04 ENCOUNTER — Other Ambulatory Visit: Payer: Medicare Other

## 2018-10-05 ENCOUNTER — Other Ambulatory Visit: Payer: Self-pay | Admitting: Nurse Practitioner

## 2018-10-06 ENCOUNTER — Other Ambulatory Visit: Payer: Medicare Other

## 2018-10-06 ENCOUNTER — Other Ambulatory Visit: Payer: Self-pay

## 2018-10-07 LAB — BASIC METABOLIC PANEL
BUN: 19 mg/dL (ref 7–25)
CO2: 33 mmol/L — ABNORMAL HIGH (ref 20–32)
Calcium: 9.9 mg/dL (ref 8.6–10.4)
Chloride: 102 mmol/L (ref 98–110)
Creat: 0.81 mg/dL (ref 0.60–0.93)
Glucose, Bld: 125 mg/dL — ABNORMAL HIGH (ref 65–99)
Potassium: 4.3 mmol/L (ref 3.5–5.3)
Sodium: 139 mmol/L (ref 135–146)

## 2018-11-09 ENCOUNTER — Other Ambulatory Visit: Payer: Self-pay

## 2018-11-09 ENCOUNTER — Ambulatory Visit
Admission: RE | Admit: 2018-11-09 | Discharge: 2018-11-09 | Disposition: A | Discharge status: Home / Self Care | Payer: Medicare Other | Source: Ambulatory Visit | Attending: Nurse Practitioner | Admitting: Nurse Practitioner

## 2018-11-09 DIAGNOSIS — Z1231 Encounter for screening mammogram for malignant neoplasm of breast: Secondary | ICD-10-CM | POA: Diagnosis not present

## 2018-11-11 ENCOUNTER — Other Ambulatory Visit: Payer: Self-pay

## 2018-11-11 ENCOUNTER — Encounter: Payer: Self-pay | Admitting: Internal Medicine

## 2018-11-11 ENCOUNTER — Ambulatory Visit (INDEPENDENT_AMBULATORY_CARE_PROVIDER_SITE_OTHER): Payer: Medicare Other | Admitting: Internal Medicine

## 2018-11-11 VITALS — BP 140/80 | HR 69 | Temp 98.1°F | Ht 59.0 in | Wt 182.0 lb

## 2018-11-11 DIAGNOSIS — M170 Bilateral primary osteoarthritis of knee: Secondary | ICD-10-CM | POA: Diagnosis not present

## 2018-11-11 MED ORDER — METHYLPREDNISOLONE ACETATE 40 MG/ML IJ SUSP
40.0000 mg | Freq: Once | INTRAMUSCULAR | Status: AC
  Administered 2018-11-11: 80 mg via INTRA_ARTICULAR

## 2018-11-11 NOTE — Progress Notes (Signed)
Location:  [facility] clinic Provider: Viera Okonski L. Mariea Clonts, D.O., C.M.D.  Goals of Care:  Advanced Directives 08/18/2018  Does Patient Have a Medical Advance Directive? No  Would patient like information on creating a medical advance directive? No - Patient declined     Chief Complaint  Patient presents with  . Acute Visit    bilateral knee injections for arthitis    HPI: Patient is a 73 y.o. female seen today for an acute visit for bilateral knee injections for her osteoarthritis.  Right is more painful than left.  Since she's started getting knee injections for her pain, she's been able to walk in their neighborhood and has lost some weight.  Her son is trying to regular his parents' diets which is also helping.  BP at upper limits of normal today.  She is always a bit nervous for the injections.    Past Medical History:  Diagnosis Date  . Arthritis   . High blood pressure     Past Surgical History:  Procedure Laterality Date  . ABDOMINAL HYSTERECTOMY  2013   due to cancer in uterus   . CATARACT EXTRACTION W/ INTRAOCULAR LENS  IMPLANT, BILATERAL  2014  . PARTIAL NEPHRECTOMY  2014   Right upper, mass    No Known Allergies  Outpatient Encounter Medications as of 11/11/2018  Medication Sig  . Ascorbic Acid (VITAMIN C) 1000 MG tablet Take 1,000 mg by mouth 2 (two) times a week.  Marland Kitchen atenolol (TENORMIN) 25 MG tablet Take 1 tablet (25 mg total) by mouth daily.  . Blood Glucose Monitoring Suppl (ONE TOUCH ULTRA 2) w/Device KIT Use to check blood sugars one time daily. Dx:E11.9  . Calcium Carbonate-Vitamin D (CALTRATE 600+D) 600-400 MG-UNIT tablet Take 1 tablet by mouth 2 (two) times daily.  Marland Kitchen glucose blood test strip Use to check blood sugars one time daily. Dx: E11.9  . hydrochlorothiazide (MICROZIDE) 12.5 MG capsule Take 1 capsule (12.5 mg total) by mouth daily. for blood pressure  . ONE TOUCH LANCETS MISC Use to check blood sugars one time daily. Dx: E11.9  . Polyethyl Glycol-Propyl  Glycol (SYSTANE) 0.4-0.3 % SOLN Apply to eye as needed.  . rosuvastatin (CRESTOR) 10 MG tablet Take 1 tablet (10 mg total) by mouth daily.  . valsartan (DIOVAN) 320 MG tablet TAKE 1 TABLET BY MOUTH EVERY DAY   No facility-administered encounter medications on file as of 11/11/2018.     Review of Systems:  Review of Systems  Constitutional: Negative for chills and fever.  HENT: Negative for congestion and sore throat.   Respiratory: Negative for cough and shortness of breath.   Cardiovascular: Negative for chest pain, palpitations and leg swelling.  Gastrointestinal: Negative for diarrhea.  Musculoskeletal: Positive for joint pain. Negative for falls.  Skin: Negative for rash.  Neurological: Negative for dizziness.  Psychiatric/Behavioral: The patient is not nervous/anxious and does not have insomnia.     Health Maintenance  Topic Date Due  . OPHTHALMOLOGY EXAM  03/25/1955  . INFLUENZA VACCINE  10/23/2018  . FOOT EXAM  11/18/2018 (Originally 03/25/1955)  . HEMOGLOBIN A1C  02/24/2019  . MAMMOGRAM  11/08/2020  . Fecal DNA (Cologuard)  08/04/2021  . TETANUS/TDAP  03/25/2023  . DEXA SCAN  Completed  . PNA vac Low Risk Adult  Completed  . Hepatitis C Screening  Discontinued    Physical Exam: There were no vitals filed for this visit. There is no height or weight on file to calculate BMI. Physical Exam Constitutional:  General: She is not in acute distress.    Appearance: Normal appearance. She is obese. She is not ill-appearing or toxic-appearing.  HENT:     Head: Normocephalic and atraumatic.  Cardiovascular:     Heart sounds: Normal heart sounds.  Pulmonary:     Effort: Pulmonary effort is normal.     Breath sounds: Normal breath sounds.  Musculoskeletal:     Comments: Bilateral knee sensitivity--ticklish, is a bit tender right greater than left also  Skin:    General: Skin is warm and dry.  Neurological:     General: No focal deficit present.     Mental Status: She  is alert.  Psychiatric:        Mood and Affect: Mood normal.     Labs reviewed: Basic Metabolic Panel: Recent Labs    01/18/18 0916 08/25/18 0848 10/06/18 0847  NA 139 140 139  K 4.2 4.5 4.3  CL 101 100 102  CO2 33* 34* 33*  GLUCOSE 129* 122* 125*  BUN _0 CREATININE 0.75 0.91 0.81  CALCIUM 9.8 10.8* 9.9   Liver Function Tests: Recent Labs    01/18/18 0916 08/25/18 0848  AST 16 17  ALT 11 11  BILITOT 0.7 1.0  PROT 7.1 7.1   No results for input(s): LIPASE, AMYLASE in the last 8760 hours. No results for input(s): AMMONIA in the last 8760 hours. CBC: Recent Labs    01/18/18 0916 08/25/18 0848  WBC 5.8 5.6  NEUTROABS 3,323 2,688  HGB 12.0 13.0  HCT 37.8 39.3  MCV 92.4 92.0  PLT 226 210   Lipid Panel: Recent Labs    01/18/18 0916 08/25/18 0848  CHOL 132 146  HDL 49* 60  LDLCALC 70 74  TRIG 58 50  CHOLHDL 2.7 2.4   Lab Results  Component Value Date   HGBA1C 7.0 (H) 08/25/2018    Procedures since last visit: Mm 3d Screen Breast Bilateral  Result Date: 11/09/2018 CLINICAL DATA:  Screening. EXAM: DIGITAL SCREENING BILATERAL MAMMOGRAM WITH TOMO AND CAD COMPARISON:  Previous exam(s). ACR Breast Density Category b: There are scattered areas of fibroglandular density. FINDINGS: There are no findings suspicious for malignancy. Images were processed with CAD. IMPRESSION: No mammographic evidence of malignancy. A result letter of this screening mammogram will be mailed directly to the patient. RECOMMENDATION: Screening mammogram in one year. (Code:SM-B-01Y) BI-RADS CATEGORY  1: Negative. Electronically Signed   By: Ammie Ferrier M.D.   On: 11/09/2018 13:24    Assessment/Plan 1. Primary osteoarthritis of both knees -Procedure note left knee injection verbal consent was obtained to inject left knee joint  Timeout was completed to confirm the site of injection  The medications used were 40 mg of Depo-Medrol and 1% lidocaine 3 cc  Anesthesia was  provided by bupivicaine and the skin was prepped with alcohol.  After cleaning the skin with alcohol a 20-gauge needle was used to inject the left knee joint. There were no complications. A sterile bandage was applied.  Procedure note right knee injection verbal consent was obtained to inject right knee joint  Timeout was completed to confirm the site of injection  The medications used were 40 mg of Depo-Medrol and 1% lidocaine 3 cc  Anesthesia was provided by bupivicaine and the skin was prepped with alcohol.  After cleaning the skin with alcohol a 20-gauge needle was used to inject the right knee joint. There were no complications. A sterile bandage was applied.  She tolerated both knee injections  well.    Labs/tests ordered: no new Next appt:  02/28/2019--keep as scheduled with NP Eubanks  Aronda Burford L. Jurni Cesaro, D.O. [facility] [address], [place] [address] Cell Phone (Mon-Fri 8am-5pm):  [phone] On Call:  [phone] & follow prompts after 5pm & weekends Office Phone:  [phone] Office Fax:  [phone]

## 2018-11-11 NOTE — Patient Instructions (Addendum)
You may apply ice for the next couple of days as needed for any discomfort after the injections.    Knee Injection A knee injection is a procedure to get medicine into your knee joint to relieve the pain, swelling, and stiffness of arthritis. Your health care provider uses a needle to inject medicine, which may also help to lubricate and cushion your knee joint. You may need more than one injection. Tell a health care provider about:  Any allergies you have.  All medicines you are taking, including vitamins, herbs, eye drops, creams, and over-the-counter medicines.  Any problems you or family members have had with anesthetic medicines.  Any blood disorders you have.  Any surgeries you have had.  Any medical conditions you have.  Whether you are pregnant or may be pregnant. What are the risks? Generally, this is a safe procedure. However, problems may occur, including:  Infection.  Bleeding.  Symptoms that get worse.  Damage to the area around your knee.  Allergic reaction to any of the medicines.  Skin reactions from repeated injections. What happens before the procedure?  Ask your health care provider about changing or stopping your regular medicines. This is especially important if you are taking diabetes medicines or blood thinners.  Plan to have someone take you home from the hospital or clinic. What happens during the procedure?   You will sit or lie down in a position for your knee to be treated.  The skin over your kneecap will be cleaned with a germ-killing soap.  You will be given a medicine that numbs the area (local anesthetic). You may feel some stinging.  The medicine will be injected into your knee. The needle is carefully placed between your kneecap and your knee. The medicine is injected into the joint space.  The needle will be removed at the end of the procedure.  A bandage (dressing) may be placed over the injection site. The procedure may vary  among health care providers and hospitals. What can I expect after the procedure?  Your blood pressure, heart rate, breathing rate, and blood oxygen level will be monitored until you leave the hospital or clinic.  You may have to move your knee through its full range of motion. This helps to get all the medicine into your joint space.  You will be watched to make sure that you do not have a reaction to the injected medicine.  You may feel more pain, swelling, and warmth than you did before the injection. This reaction may last about 1-2 days. Follow these instructions at home: Medicines  Take over-the-counter and prescription medicines only as told by your doctor.  Do not drive or use heavy machinery while taking prescription pain medicine.  Do not take medicines such as aspirin and ibuprofen unless your health care provider tells you to take them. Injection site care  Follow instructions from your health care provider about: ? How to take care of your puncture site. ? When and how you should change your dressing. ? When you should remove your dressing.  Check your injection area every day for signs of infection. Check for: ? More redness, swelling, or pain after 2 days. ? Fluid or blood. ? Pus or a bad smell. ? Warmth. Managing pain, stiffness, and swelling   If directed, put ice on the injection area: ? Put ice in a plastic bag. ? Place a towel between your skin and the bag. ? Leave the ice on for 20 minutes,  2-3 times per day.  Do not apply heat to your knee.  Raise (elevate) the injection area above the level of your heart while you are sitting or lying down. General instructions  If you were given a dressing, keep it dry until your health care provider says it can be removed. Ask your health care provider when you can start showering or taking a bath.  Avoid strenuous activities for as long as directed by your health care provider. Ask your health care provider when  you can return to your normal activities.  Keep all follow-up visits as told by your health care provider. This is important. You may need more injections. Contact a health care provider if you have:  A fever.  Warmth in your injection area.  Fluid, blood, or pus coming from your injection site.  Symptoms at your injection site that last longer than 2 days after your procedure. Get help right away if:  Your knee: ? Turns very red. ? Becomes very swollen. ? Is in severe pain. Summary  A knee injection is a procedure to get medicine into your knee joint to relieve the pain, swelling, and stiffness of arthritis.  A needle is carefully placed between your kneecap and your knee to inject medicine into the joint space.  Before the procedure, ask your health care provider about changing or stopping your regular medicines, especially if you are taking diabetes medicines or blood thinners.  Contact your health care provider if you have any problems or questions after your procedure. This information is not intended to replace advice given to you by your health care provider. Make sure you discuss any questions you have with your health care provider. Document Released: 06/01/2006 Document Revised: 03/30/2017 Document Reviewed: 03/30/2017 Elsevier Patient Education  2020 [employer].

## 2018-11-17 ENCOUNTER — Other Ambulatory Visit: Payer: Self-pay

## 2018-11-17 ENCOUNTER — Ambulatory Visit
Admission: RE | Admit: 2018-11-17 | Discharge: 2018-11-17 | Disposition: A | Discharge status: Home / Self Care | Payer: Medicare Other | Source: Ambulatory Visit | Attending: Nurse Practitioner | Admitting: Nurse Practitioner

## 2018-11-17 DIAGNOSIS — Z780 Asymptomatic menopausal state: Secondary | ICD-10-CM | POA: Diagnosis not present

## 2018-11-17 DIAGNOSIS — Z13820 Encounter for screening for osteoporosis: Secondary | ICD-10-CM | POA: Diagnosis not present

## 2018-11-17 DIAGNOSIS — E2839 Other primary ovarian failure: Secondary | ICD-10-CM

## 2019-02-28 ENCOUNTER — Other Ambulatory Visit: Payer: Self-pay

## 2019-02-28 ENCOUNTER — Encounter: Payer: Self-pay | Admitting: Nurse Practitioner

## 2019-02-28 ENCOUNTER — Ambulatory Visit (INDEPENDENT_AMBULATORY_CARE_PROVIDER_SITE_OTHER): Payer: Medicare Other | Admitting: Nurse Practitioner

## 2019-02-28 DIAGNOSIS — M170 Bilateral primary osteoarthritis of knee: Secondary | ICD-10-CM | POA: Diagnosis not present

## 2019-02-28 DIAGNOSIS — I10 Essential (primary) hypertension: Secondary | ICD-10-CM | POA: Diagnosis not present

## 2019-02-28 DIAGNOSIS — E119 Type 2 diabetes mellitus without complications: Secondary | ICD-10-CM

## 2019-02-28 DIAGNOSIS — M8580 Other specified disorders of bone density and structure, unspecified site: Secondary | ICD-10-CM | POA: Diagnosis not present

## 2019-02-28 DIAGNOSIS — Z6837 Body mass index (BMI) 37.0-37.9, adult: Secondary | ICD-10-CM | POA: Insufficient documentation

## 2019-02-28 DIAGNOSIS — E785 Hyperlipidemia, unspecified: Secondary | ICD-10-CM | POA: Diagnosis not present

## 2019-02-28 NOTE — Progress Notes (Signed)
    This service is provided via telemedicine  No vital signs collected/recorded due to the encounter was a telemedicine visit.   Location of patient (ex: home, work[place]  Patient consents to a telephone visit:  Yes  Location of the provider (ex: office, home):  [facility], Office   Name of any referring provider:  N/A  Names of all persons participating in the telemedicine service and their role in the encounter:  S.Chrae B/CMA, [name], NP, Windle Guard (son) and Patient   Time spent on call: 5 min with medical assistant

## 2019-02-28 NOTE — Progress Notes (Signed)
Careteam: Patient Care Team: Lauree Chandler, NP as PCP - General (Geriatric Medicine)  Advanced Directive information Does Patient Have a Medical Advance Directive?: No, Would patient like information on creating a medical advance directive?: No - Patient declined  No Known Allergies  Chief Complaint  Patient presents with  . Medical Management of Chronic Issues    6 month follow-up.   . Immunizations    Discuss need for flu vaccine, patient refused.  . Quality Metric Gaps    Foot and eye exam due      HPI: Patient is a 73 y.o. female for televisit.  She declines flu vaccine- she states they will just continue to "over" quarantine at home.   OA knee- injections done to both in august- reports pain is doing well. Continues to walk 15-20 walks three times daily, slow Cold makes it worse  Hyperlipidemia- on crestor   Hypertension- elevated after walk- 132/81 after walk. Continues on atenolol, hctz and losartan. Continues on dietary modifications.   DM-no current blood sugars because they were good. Last a1c 7.0 August 25, 2018.   Osteopenia- cal was stopped due to elevated serum cal improved on recheck now only on vit d with weight bearing exercises.    Review of Systems:  Review of Systems  Constitutional: Negative for chills, fever and weight loss.  HENT: Negative for tinnitus.   Respiratory: Negative for cough, sputum production and shortness of breath.   Cardiovascular: Negative for chest pain, palpitations and leg swelling.  Gastrointestinal: Negative for abdominal pain, constipation, diarrhea and heartburn.  Genitourinary: Negative for dysuria, frequency and urgency.  Musculoskeletal: Positive for joint pain (occasionally in knee, done well with injection). Negative for back pain, falls and myalgias.  Skin: Negative.   Neurological: Negative for dizziness and headaches.       Itchy burning sensation to right hand index finger. ONLY at middle finger, backside  behind nail. Comes and goes. On and off for over 4 years.   Psychiatric/Behavioral: Negative for depression and memory loss. The patient does not have insomnia.     Past Medical History:  Diagnosis Date  . Arthritis   . High blood pressure    Past Surgical History:  Procedure Laterality Date  . ABDOMINAL HYSTERECTOMY  2013   due to cancer in uterus   . CATARACT EXTRACTION W/ INTRAOCULAR LENS  IMPLANT, BILATERAL  2014  . PARTIAL NEPHRECTOMY  2014   Right upper, mass   Social History:   reports that she has never smoked. She has never used smokeless tobacco. She reports that she does not drink alcohol or use drugs.  Family History  Problem Relation Age of Onset  . Arthritis Mother   . Arthritis Father     Medications: Patient's Medications  New Prescriptions   No medications on file  Previous Medications   ASCORBIC ACID (VITAMIN C) 1000 MG TABLET    Take 1,000 mg by mouth 2 (two) times a week.   ATENOLOL (TENORMIN) 25 MG TABLET    Take 1 tablet (25 mg total) by mouth daily.   BLOOD GLUCOSE MONITORING SUPPL (ONE TOUCH ULTRA 2) W/DEVICE KIT    Use to check blood sugars one time daily. Dx:E11.9   CALCIUM CARBONATE-VITAMIN D (CALTRATE 600+D) 600-400 MG-UNIT TABLET    Take 1 tablet by mouth 2 (two) times daily.   GLUCOSE BLOOD TEST STRIP    Use to check blood sugars one time daily. Dx: E11.9   HYDROCHLOROTHIAZIDE (MICROZIDE) 12.5 MG  CAPSULE    Take 1 capsule (12.5 mg total) by mouth daily. for blood pressure   ONE TOUCH LANCETS MISC    Use to check blood sugars one time daily. Dx: E11.9   POLYETHYL GLYCOL-PROPYL GLYCOL (SYSTANE) 0.4-0.3 % SOLN    Apply to eye as needed.   ROSUVASTATIN (CRESTOR) 10 MG TABLET    Take 1 tablet (10 mg total) by mouth daily.   VALSARTAN (DIOVAN) 320 MG TABLET    TAKE 1 TABLET BY MOUTH EVERY DAY  Modified Medications   No medications on file  Discontinued Medications   No medications on file    Physical Exam:  There were no vitals filed for this  visit. There is no height or weight on file to calculate BMI. Wt Readings from Last 3 Encounters:  11/11/18 182 lb (82.6 kg)  02/01/18 181 lb (82.1 kg)  01/25/18 184 lb 6.4 oz (83.6 kg)      Labs reviewed: Basic Metabolic Panel: Recent Labs    08/25/18 0848 10/06/18 0847  NA 140 139  K 4.5 4.3  CL 100 102  CO2 34* 33*  GLUCOSE 122* 125*  BUN 18 19  CREATININE 0.91 0.81  CALCIUM 10.8* 9.9   Liver Function Tests: Recent Labs    08/25/18 0848  AST 17  ALT 11  BILITOT 1.0  PROT 7.1   No results for input(s): LIPASE, AMYLASE in the last 8760 hours. No results for input(s): AMMONIA in the last 8760 hours. CBC: Recent Labs    08/25/18 0848  WBC 5.6  NEUTROABS 2,688  HGB 13.0  HCT 39.3  MCV 92.0  PLT 210   Lipid Panel: Recent Labs    08/25/18 0848  CHOL 146  HDL 60  LDLCALC 74  TRIG 50  CHOLHDL 2.4   TSH: No results for input(s): TSH in the last 8760 hours. A1C: Lab Results  Component Value Date   HGBA1C 7.0 (H) 08/25/2018     Assessment/Plan 1. Primary osteoarthritis of both knees -pt having benefit from injections. Continues to stay active. Continue tylenol as needed  2. Essential hypertension -controlled on current regimen. Continues on valsartan, hctz and atenolol.  - CBC with Differential/Platelet; Future  3. Hyperlipidemia, unspecified hyperlipidemia type -continues on crestor with dietary modification and increase in physical activity. - COMPLETE METABOLIC PANEL WITH GFR; Future - Lipid Panel; Future  4. Type 2 diabetes mellitus without complication, without long-term current use of insulin ([place]) -continues to work on dietary changes.  Encouraged dietary compliance, routine foot care/monitoring and to keep up with diabetic eye exams through ophthalmology   - Hemoglobin A1c; Future  5. Osteopenia, unspecified location Continues on vit d with weight bearing activity   Next appt: 6 months, to come in now for updated labs.  Carlos American. Harle Battiest  [facility] [phone]    Virtual Visit via Telephone Note  I connected with pt and son on 02/28/19 at  9:30 AM EST by telephone and verified that I am speaking with the correct person using two identifiers.  Location: Patient: home Provider: office   I discussed the limitations, risks, security and privacy concerns of performing an evaluation and management service by telephone and the availability of in person appointments. I also discussed with the patient that there may be a patient responsible charge related to this service. The patient expressed understanding and agreed to proceed.   I discussed the assessment and treatment plan with the patient. The patient  was provided an opportunity to ask questions and all were answered. The patient agreed with the plan and demonstrated an understanding of the instructions.   The patient was advised to call back or seek an in-person evaluation if the symptoms worsen or if the condition fails to improve as anticipated.  I provided 16 minutes of non-face-to-face time during this encounter.  Carlos American. Harle Battiest Avs printed and mailed

## 2019-03-09 ENCOUNTER — Other Ambulatory Visit: Payer: Self-pay | Admitting: Nurse Practitioner

## 2019-03-09 DIAGNOSIS — E785 Hyperlipidemia, unspecified: Secondary | ICD-10-CM

## 2019-03-09 DIAGNOSIS — I10 Essential (primary) hypertension: Secondary | ICD-10-CM

## 2019-03-11 ENCOUNTER — Other Ambulatory Visit: Payer: Self-pay | Admitting: Nurse Practitioner

## 2019-03-11 DIAGNOSIS — I10 Essential (primary) hypertension: Secondary | ICD-10-CM

## 2019-03-22 ENCOUNTER — Other Ambulatory Visit: Payer: Self-pay

## 2019-03-22 ENCOUNTER — Other Ambulatory Visit: Payer: Medicare Other

## 2019-03-22 DIAGNOSIS — I10 Essential (primary) hypertension: Secondary | ICD-10-CM

## 2019-03-22 DIAGNOSIS — E785 Hyperlipidemia, unspecified: Secondary | ICD-10-CM

## 2019-03-22 DIAGNOSIS — E119 Type 2 diabetes mellitus without complications: Secondary | ICD-10-CM

## 2019-03-23 LAB — LIPID PANEL
Cholesterol: 217 mg/dL — ABNORMAL HIGH (ref <200)
HDL: 58 mg/dL (ref >=50)
LDL Cholesterol (Calc): 143 mg/dL (calc) — ABNORMAL HIGH
Non-HDL Cholesterol (Calc): 159 mg/dL (calc) — ABNORMAL HIGH (ref <130)
Total CHOL/HDL Ratio: 3.7 (calc) (ref <5.0)
Triglycerides: 65 mg/dL (ref <150)

## 2019-03-23 LAB — COMPLETE METABOLIC PANEL WITH GFR
AG Ratio: 1.4 (calc) (ref 1.0–2.5)
ALT: 11 U/L (ref 6–29)
AST: 16 U/L (ref 10–35)
Albumin: 4.1 g/dL (ref 3.6–5.1)
Alkaline phosphatase (APISO): 44 U/L (ref 37–153)
BUN: 18 mg/dL (ref 7–25)
CO2: 32 mmol/L (ref 20–32)
Calcium: 10 mg/dL (ref 8.6–10.4)
Chloride: 99 mmol/L (ref 98–110)
Creat: 0.75 mg/dL (ref 0.60–0.93)
GFR, Est African American: 92 mL/min/{1.73_m2} (ref >=60)
GFR, Est Non African American: 79 mL/min/{1.73_m2} (ref >=60)
Globulin: 3 g/dL (calc) (ref 1.9–3.7)
Glucose, Bld: 117 mg/dL — ABNORMAL HIGH (ref 65–99)
Potassium: 3.9 mmol/L (ref 3.5–5.3)
Sodium: 140 mmol/L (ref 135–146)
Total Bilirubin: 0.5 mg/dL (ref 0.2–1.2)
Total Protein: 7.1 g/dL (ref 6.1–8.1)

## 2019-03-23 LAB — CBC WITH DIFFERENTIAL/PLATELET
Absolute Monocytes: 487 cells/uL (ref 200–950)
Basophils Absolute: 58 cells/uL (ref 0–200)
Basophils Relative: 1 %
Eosinophils Absolute: 220 cells/uL (ref 15–500)
Eosinophils Relative: 3.8 %
HCT: 41.8 % (ref 35.0–45.0)
Hemoglobin: 13.6 g/dL (ref 11.7–15.5)
Lymphs Abs: 2117 cells/uL (ref 850–3900)
MCH: 30.2 pg (ref 27.0–33.0)
MCHC: 32.5 g/dL (ref 32.0–36.0)
MCV: 92.9 fL (ref 80.0–100.0)
MPV: 11 fL (ref 7.5–12.5)
Monocytes Relative: 8.4 %
Neutro Abs: 2917 cells/uL (ref 1500–7800)
Neutrophils Relative %: 50.3 %
Platelets: 229 10*3/uL (ref 140–400)
RBC: 4.5 10*6/uL (ref 3.80–5.10)
RDW: 11.6 % (ref 11.0–15.0)
Total Lymphocyte: 36.5 %
WBC: 5.8 10*3/uL (ref 3.8–10.8)

## 2019-03-23 LAB — HEMOGLOBIN A1C
Hgb A1c MFr Bld: 6.7 % of total Hgb — ABNORMAL HIGH (ref <5.7)
Mean Plasma Glucose: 146 (calc)
eAG (mmol/L): 8.1 (calc)

## 2019-04-08 ENCOUNTER — Other Ambulatory Visit: Payer: Self-pay | Admitting: Nurse Practitioner

## 2019-06-09 ENCOUNTER — Encounter: Payer: Self-pay | Admitting: Internal Medicine

## 2019-06-09 ENCOUNTER — Ambulatory Visit (INDEPENDENT_AMBULATORY_CARE_PROVIDER_SITE_OTHER): Payer: Medicare Other | Admitting: Internal Medicine

## 2019-06-09 ENCOUNTER — Other Ambulatory Visit: Payer: Self-pay

## 2019-06-09 VITALS — BP 138/78 | HR 63 | Temp 97.5°F | Ht 59.0 in | Wt 183.0 lb

## 2019-06-09 DIAGNOSIS — M170 Bilateral primary osteoarthritis of knee: Secondary | ICD-10-CM | POA: Diagnosis not present

## 2019-06-09 MED ORDER — METHYLPREDNISOLONE ACETATE 40 MG/ML IJ SUSP
80.0000 mg | Freq: Once | INTRAMUSCULAR | Status: AC
  Administered 2019-06-09: 80 mg via INTRA_ARTICULAR

## 2019-06-09 NOTE — Addendum Note (Signed)
Addended by: Karlene Lineman on: 06/09/2019 11:07 AM   Modules accepted: Orders

## 2019-06-09 NOTE — Patient Instructions (Signed)
Best wishes in [place]!   We will miss you.   We can send records to your new PCP there if needed.  If they use the Epic medical record system, they may be able to view our notes with a simple code.

## 2019-06-09 NOTE — Progress Notes (Signed)
Location:  [facility] Provider: Ceira Hoeschen L. Mariea Clonts, D.O., C.M.D.  Goals of Care:  Advanced Directives 06/09/2019  Does Patient Have a Medical Advance Directive? No  Would patient like information on creating a medical advance directive? No - Patient declined     Chief Complaint  Patient presents with  . Medical Management of Chronic Issues    L and R knee injection    HPI: Patient is a 74 y.o. female seen today for an acute visit for bilateral knee injections for advanced osteoarthritis not responsive to other conservative treatments.  She is using braces on the knees now for support as the right wants to give out at times.  She is still walking for exercise.    They are moving to [place] in the next 2-3 weeks for her son's job.  I suggested seeing another certified geriatrician there.    Past Medical History:  Diagnosis Date  . Arthritis   . High blood pressure     Past Surgical History:  Procedure Laterality Date  . ABDOMINAL HYSTERECTOMY  2013   due to cancer in uterus   . CATARACT EXTRACTION W/ INTRAOCULAR LENS  IMPLANT, BILATERAL  2014  . PARTIAL NEPHRECTOMY  2014   Right upper, mass    No Known Allergies  Outpatient Encounter Medications as of 06/09/2019  Medication Sig  . Ascorbic Acid (VITAMIN C) 1000 MG tablet Take 1,000 mg by mouth 2 (two) times a week.  Marland Kitchen atenolol (TENORMIN) 25 MG tablet TAKE 1 TABLET BY MOUTH EVERY DAY  . Blood Glucose Monitoring Suppl (ONE TOUCH ULTRA 2) w/Device KIT Use to check blood sugars one time daily. Dx:E11.9  . cholecalciferol (VITAMIN D3) 25 MCG (1000 UT) tablet Take 1,000 Units by mouth daily.  Marland Kitchen glucose blood test strip Use to check blood sugars one time daily. Dx: E11.9  . hydrochlorothiazide (MICROZIDE) 12.5 MG capsule TAKE 1 CAPSULE (12.5 MG TOTAL) BY MOUTH DAILY. FOR BLOOD PRESSURE  . ONE TOUCH LANCETS MISC Use to check blood sugars one time daily. Dx: E11.9  . Polyethyl Glycol-Propyl Glycol (SYSTANE) 0.4-0.3 % SOLN Apply to  eye as needed.  . rosuvastatin (CRESTOR) 10 MG tablet TAKE 1 TABLET BY MOUTH EVERY DAY  . valsartan (DIOVAN) 320 MG tablet TAKE 1 TABLET BY MOUTH EVERY DAY   No facility-administered encounter medications on file as of 06/09/2019.    Review of Systems:  Review of Systems  Constitutional: Negative for chills and fever.  Eyes: Negative for blurred vision.  Respiratory: Negative for shortness of breath.   Cardiovascular: Positive for leg swelling. Negative for chest pain.  Musculoskeletal: Positive for joint pain. Negative for falls.    Health Maintenance  Topic Date Due  . FOOT EXAM  Never done  . OPHTHALMOLOGY EXAM  Never done  . INFLUENZA VACCINE  06/22/2019 (Originally 10/23/2018)  . HEMOGLOBIN A1C  09/20/2019  . MAMMOGRAM  11/08/2020  . Fecal DNA (Cologuard)  08/04/2021  . TETANUS/TDAP  03/25/2023  . DEXA SCAN  Completed  . PNA vac Low Risk Adult  Completed  . Hepatitis C Screening  Discontinued    Physical Exam: Vitals:   06/09/19 0926  BP: 138/78  Pulse: 63  Temp: (!) 97.5 F (36.4 C)  TempSrc: Temporal  SpO2: 97%  Weight: 183 lb (83 kg)  Height: 4' 11"  (1.499 m)   Body mass index is 36.96 kg/m. Physical Exam Constitutional:      General: She is not in acute distress.    Appearance:  Normal appearance. She is not ill-appearing or toxic-appearing.  HENT:     Head: Normocephalic and atraumatic.  Pulmonary:     Effort: Pulmonary effort is normal.  Abdominal:     General: Bowel sounds are normal.  Musculoskeletal:     Right lower leg: Edema present.     Left lower leg: Edema present.     Comments: Bilateral knee pain on ambulation, uses her assistive device and now neoprene support braces  Skin:    General: Skin is warm and dry.  Neurological:     General: No focal deficit present.     Mental Status: She is alert. Mental status is at baseline.  Psychiatric:        Mood and Affect: Mood normal.        Behavior: Behavior normal.     Labs reviewed: Basic  Metabolic Panel: Recent Labs    08/25/18 0848 10/06/18 0847 03/22/19 0837  NA 140 139 140  K 4.5 4.3 3.9  CL 100 102 99  CO2 34* 33* 32  GLUCOSE 122* 125* 117*  BUN 18 19 18   CREATININE 0.91 0.81 0.75  CALCIUM 10.8* 9.9 10.0   Liver Function Tests: Recent Labs    08/25/18 0848 03/22/19 0837  AST 17 16  ALT 11 11  BILITOT 1.0 0.5  PROT 7.1 7.1   No results for input(s): LIPASE, AMYLASE in the last 8760 hours. No results for input(s): AMMONIA in the last 8760 hours. CBC: Recent Labs    08/25/18 0848 03/22/19 0837  WBC 5.6 5.8  NEUTROABS 2,688 2,917  HGB 13.0 13.6  HCT 39.3 41.8  MCV 92.0 92.9  PLT 210 229   Lipid Panel: Recent Labs    08/25/18 0848 03/22/19 0837  CHOL 146 217*  HDL 60 58  LDLCALC 74 143*  TRIG 50 65  CHOLHDL 2.4 3.7   Lab Results  Component Value Date   HGBA1C 6.7 (H) 03/22/2019    Procedures since last visit: No results found.  Assessment/Plan 1. Primary osteoarthritis of both knees -bilateral knees with cleansed with betadine x 2 medially, then sprayed with bupivicaine topical anesthetic, injected with 1cc of 65m depomedrol and 2 cc of 1% lidocaine, cleansed with alcohol and bandaids applied -some immediate relief noted to permit continued walking to maintain mobility  Labs/tests ordered:  none Next appt:  [facility] [place] [facility] [place]  Anthonyjames Bargar L. Orit Sanville, D.O. [facility] [address] [place] [id number] Phone (Mon-Fri 8am-5pm):  [phone] Call:  [phone]& follow prompts after 5pm & weekends Office Phone:  [phone] Fax:  [phone]

## 2019-07-04 ENCOUNTER — Other Ambulatory Visit: Payer: Self-pay

## 2019-07-04 ENCOUNTER — Telehealth: Payer: Self-pay

## 2019-07-04 ENCOUNTER — Encounter: Payer: Medicare Other | Admitting: Nurse Practitioner

## 2019-07-04 NOTE — Telephone Encounter (Signed)
Noted thank you

## 2019-07-04 NOTE — Telephone Encounter (Signed)
I called patient to begin her tele-health visit for her Annual Wellness Visit scheduled for today at 9:00 am and patients son Kerri Chan states patient was still asleep and he was not going to wake her.  Abdulrehman rescheduled appointment to Wednesday 07/06/2019 at 10:00 am.

## 2019-07-05 NOTE — Progress Notes (Signed)
This encounter was created in error - please disregard.

## 2019-07-06 ENCOUNTER — Telehealth: Payer: Self-pay

## 2019-07-06 ENCOUNTER — Ambulatory Visit (INDEPENDENT_AMBULATORY_CARE_PROVIDER_SITE_OTHER): Payer: Medicare Other | Admitting: Nurse Practitioner

## 2019-07-06 ENCOUNTER — Other Ambulatory Visit: Payer: Self-pay

## 2019-07-06 DIAGNOSIS — Z0000 Encounter for general adult medical examination without abnormal findings: Secondary | ICD-10-CM

## 2019-07-06 NOTE — Telephone Encounter (Signed)
Ms. Kerri Chan, Kerri Chan are scheduled for a virtual visit with your provider today.    Just as we do with appointments in the office, we must obtain your consent to participate.  Your consent will be active for this visit and any virtual visit you may have with one of our providers in the next 365 days.    If you have a MyChart account, I can also send a copy of this consent to you electronically.  All virtual visits are billed to your insurance company just like a traditional visit in the office.  As this is a virtual visit, video technology does not allow for your provider to perform a traditional examination.  This may limit your provider's ability to fully assess your condition.  If your provider identifies any concerns that need to be evaluated in person or the need to arrange testing such as labs, EKG, etc, we will make arrangements to do so.    Although advances in technology are sophisticated, we cannot ensure that it will always work on either your end or our end.  If the connection with a video visit is poor, we may have to switch to a telephone visit.  With either a video or telephone visit, we are not always able to ensure that we have a secure connection.   I need to obtain your verbal consent now.   Are you willing to proceed with your visit today?   Kerri Chan and son Kerri Chan has provided verbal consent on 07/06/2019 for a virtual visit (video or telephone).   Kerri Chan, [place] 07/06/2019  10:07 AM

## 2019-07-06 NOTE — Progress Notes (Signed)
    This service is provided via telemedicine  No vital signs collected/recorded due to the encounter was a telemedicine visit.   Location of patient (ex: home, work):  Home   Patient consents to a telephone visit:  Yes, see telephone encounter with annual consent dated 07/06/2019  Location of the provider (ex: office, home):  [facility], Office   Name of any referring provider:  N/A  Names of all persons participating in the telemedicine service and their role in the encounter:  S.Chrae B/CMA, Abdulrehman (son), Abbey Chatters, NP, and Patient   Time spent on call:  14 min with medical assistant

## 2019-07-06 NOTE — Patient Instructions (Signed)
Kerri Chan , Thank you for taking time to come for your Medicare Wellness Visit. I appreciate your ongoing commitment to your health goals. Please review the following plan we discussed and let me know if I can assist you in the future.   Screening recommendations/referrals: Colonoscopy up to date Mammogram up to date Bone Density up to date Recommended yearly ophthalmology/optometry visit for glaucoma screening and checkup Recommended yearly dental visit for hygiene and checkup  Vaccinations: Influenza vaccine up to date Pneumococcal vaccine up to date Tdap vaccine up to date Shingles vaccine RECOMMENDED, to get shringrix at pharmacy    Advanced directives: recommend to complete  Conditions/risks identified: cardiovascular disease.   Moving to [place] in June or July, [facility] WISHES, we will miss you!    Preventive Care 74 Years and Older, Female Preventive care refers to lifestyle choices and visits with your health care provider that can promote health and wellness. What does preventive care include?  A yearly physical exam. This is also called an annual well check.  Dental exams once or twice a year.  Routine eye exams. Ask your health care provider how often you should have your eyes checked.  Personal lifestyle choices, including:  Daily care of your teeth and gums.  Regular physical activity.  Eating a healthy diet.  Avoiding tobacco and drug use.  Limiting alcohol use.  Practicing safe sex.  Taking low-dose aspirin every day.  Taking vitamin and mineral supplements as recommended by your health care provider. What happens during an annual well check? The services and screenings done by your health care provider during your annual well check will depend on your age, overall health, lifestyle risk factors, and family history of disease. Counseling  Your health care provider may ask you questions about your:  Alcohol use.  Tobacco use.  Drug use.  Emotional  well-being.  Home and relationship well-being.  Sexual activity.  Eating habits.  History of falls.  Memory and ability to understand (cognition).  Work and work Astronomer.  Reproductive health. Screening  You may have the following tests or measurements:  Height, weight, and BMI.  Blood pressure.  Lipid and cholesterol levels. These may be checked every 5 years, or more frequently if you are over 74 years old.  Skin check.  Lung cancer screening. You may have this screening every year starting at age 74 if you have a 30-pack-year history of smoking and currently smoke or have quit within the past 15 years.  Fecal occult blood test (FOBT) of the stool. You may have this test every year starting at age 74.  Flexible sigmoidoscopy or colonoscopy. You may have a sigmoidoscopy every 5 years or a colonoscopy every 10 years starting at age 74.  Hepatitis C blood test.  Hepatitis B blood test.  Sexually transmitted disease (STD) testing.  Diabetes screening. This is done by checking your blood sugar (glucose) after you have not eaten for a while (fasting). You may have this done every 1-3 years.  Bone density scan. This is done to screen for osteoporosis. You may have this done starting at age 74.  Mammogram. This may be done every 1-2 years. Talk to your health care provider about how often you should have regular mammograms. Talk with your health care provider about your test results, treatment options, and if necessary, the need for more tests. Vaccines  Your health care provider may recommend certain vaccines, such as:  Influenza vaccine. This is recommended every year.  Tetanus, diphtheria,  and acellular pertussis (Tdap, Td) vaccine. You may need a Td booster every 10 years.  Zoster vaccine. You may need this after age 74.  Pneumococcal 13-valent conjugate (PCV13) vaccine. One dose is recommended after age 74.  Pneumococcal polysaccharide (PPSV23) vaccine. One  dose is recommended after age 74. Talk to your health care provider about which screenings and vaccines you need and how often you need them. This information is not intended to replace advice given to you by your health care provider. Make sure you discuss any questions you have with your health care provider. Document Released: 04/06/2015 Document Revised: 11/28/2015 Document Reviewed: 01/09/2015 Elsevier Interactive Patient Education  2017 [place] Prevention in the Home Falls can cause injuries. They can happen to people of all ages. There are many things you can do to make your home safe and to help prevent falls. What can I do on the outside of my home?  Regularly fix the edges of walkways and driveways and fix any cracks.  Remove anything that might make you trip as you walk through a door, such as a raised step or threshold.  Trim any bushes or trees on the path to your home.  Use bright outdoor lighting.  Clear any walking paths of anything that might make someone trip, such as rocks or tools.  Regularly check to see if handrails are loose or broken. Make sure that both sides of any steps have handrails.  Any raised decks and porches should have guardrails on the edges.  Have any leaves, snow, or ice cleared regularly.  Use sand or salt on walking paths during winter.  Clean up any spills in your garage right away. This includes oil or grease spills. What can I do in the bathroom?  Use night lights.  Install grab bars by the toilet and in the tub and shower. Do not use towel bars as grab bars.  Use non-skid mats or decals in the tub or shower.  If you need to sit down in the shower, use a plastic, non-slip stool.  Keep the floor dry. Clean up any water that spills on the floor as soon as it happens.  Remove soap buildup in the tub or shower regularly.  Attach bath mats securely with double-sided non-slip rug tape.  Do not have throw rugs and other  things on the floor that can make you trip. What can I do in the bedroom?  Use night lights.  Make sure that you have a light by your bed that is easy to reach.  Do not use any sheets or blankets that are too big for your bed. They should not hang down onto the floor.  Have a firm chair that has side arms. You can use this for support while you get dressed.  Do not have throw rugs and other things on the floor that can make you trip. What can I do in the kitchen?  Clean up any spills right away.  Avoid walking on wet floors.  Keep items that you use a lot in easy-to-reach places.  If you need to reach something above you, use a strong step stool that has a grab bar.  Keep electrical cords out of the way.  Do not use floor polish or wax that makes floors slippery. If you must use wax, use non-skid floor wax.  Do not have throw rugs and other things on the floor that can make you trip. What can I do with my  stairs?  Do not leave any items on the stairs.  Make sure that there are handrails on both sides of the stairs and use them. Fix handrails that are broken or loose. Make sure that handrails are as long as the stairways.  Check any carpeting to make sure that it is firmly attached to the stairs. Fix any carpet that is loose or worn.  Avoid having throw rugs at the top or bottom of the stairs. If you do have throw rugs, attach them to the floor with carpet tape.  Make sure that you have a light switch at the top of the stairs and the bottom of the stairs. If you do not have them, ask someone to add them for you. What else can I do to help prevent falls?  Wear shoes that:  Do not have high heels.  Have rubber bottoms.  Are comfortable and fit you well.  Are closed at the toe. Do not wear sandals.  If you use a stepladder:  Make sure that it is fully opened. Do not climb a closed stepladder.  Make sure that both sides of the stepladder are locked into place.  Ask  someone to hold it for you, if possible.  Clearly mark and make sure that you can see:  Any grab bars or handrails.  First and last steps.  Where the edge of each step is.  Use tools that help you move around (mobility aids) if they are needed. These include:  Canes.  Walkers.  Scooters.  Crutches.  Turn on the lights when you go into a dark area. Replace any light bulbs as soon as they burn out.  Set up your furniture so you have a clear path. Avoid moving your furniture around.  If any of your floors are uneven, fix them.  If there are any pets around you, be aware of where they are.  Review your medicines with your doctor. Some medicines can make you feel dizzy. This can increase your chance of falling. Ask your doctor what other things that you can do to help prevent falls. This information is not intended to replace advice given to you by your health care provider. Make sure you discuss any questions you have with your health care provider. Document Released: 01/04/2009 Document Revised: 08/16/2015 Document Reviewed: 04/14/2014 Elsevier Interactive Patient Education  2017 [employer].

## 2019-07-06 NOTE — Progress Notes (Signed)
Subjective:   Kerri Chan is a 74 y.o. female who presents for Medicare Annual (Subsequent) preventive examination.  Review of Systems:   Cardiac Risk Factors include: obesity (BMI >30kg/m2);sedentary lifestyle;hypertension;diabetes mellitus;dyslipidemia;advanced age (>19mn, >>33women)     Objective:     Vitals: There were no vitals taken for this visit.  There is no height or weight on file to calculate BMI.  Advanced Directives 07/06/2019 06/09/2019 02/28/2019 08/18/2018 07/01/2018 01/25/2018 06/29/2017  Does Patient Have a Medical Advance Directive? No No No No No No No  Would patient like information on creating a medical advance directive? No - Patient declined No - Patient declined No - Patient declined No - Patient declined No - Patient declined - Yes (MAU/Ambulatory/Procedural Areas - Information given)    Tobacco Social History   Tobacco Use  Smoking Status Never Smoker  Smokeless Tobacco Never Used     Counseling given: Not Answered   Clinical Intake:  Pre-visit preparation completed: Yes  Pain : No/denies pain     BMI - recorded: 36 Nutritional Status: BMI > 30  Obese Nutritional Risks: None Diabetes: Yes  How often do you need to have someone help you when you read instructions, pamphlets, or other written materials from your doctor or pharmacy?: 3 - Sometimes(depending on language)  Interpreter Needed?: Yes     Past Medical History:  Diagnosis Date  . Arthritis   . High blood pressure    Past Surgical History:  Procedure Laterality Date  . ABDOMINAL HYSTERECTOMY  2013   due to cancer in uterus   . CATARACT EXTRACTION W/ INTRAOCULAR LENS  IMPLANT, BILATERAL  2014  . PARTIAL NEPHRECTOMY  2014   Right upper, mass   Family History  Problem Relation Age of Onset  . Arthritis Mother   . Arthritis Father    Social History   Socioeconomic History  . Marital status: Married    Spouse name: Not on file  . Number of children: Not on file  . Years  of education: Not on file  . Highest education level: Not on file  Occupational History  . Not on file  Tobacco Use  . Smoking status: Never Smoker  . Smokeless tobacco: Never Used  Substance and Sexual Activity  . Alcohol use: No    Alcohol/week: 0.0 standard drinks  . Drug use: No  . Sexual activity: Not Currently  Other Topics Concern  . Not on file  Social History Narrative   Diet? Healthy      Do you drink/eat things with caffeine? Black and Green Tea      Marital status?                   m                 What year were you married? 1968      Do you live in a house, apartment, assisted living, condo, trailer, etc.? apartment      Is it one or more stories? 2 story      How many persons live in your home? 3 (husband and son)      Do you have any pets in your home? (please list) 1- a parrot (small bird)      Current or past profession: -      Do you exercise?      walk  Type & how often? light      Do you have a living will? no      Do you have a DNR form?      no                            If not, do you want to discuss one? N/A      Do you have signed POA/HPOA for forms? N/A      Social Determinants of Health   Financial Resource Strain:   . Difficulty of Paying Living Expenses:   Food Insecurity:   . Worried About Charity fundraiser in the Last Year:   . Arboriculturist in the Last Year:   Transportation Needs:   . Film/video editor (Medical):   Marland Kitchen Lack of Transportation (Non-Medical):   Physical Activity:   . Days of Exercise per Week:   . Minutes of Exercise per Session:   Stress:   . Feeling of Stress :   Social Connections:   . Frequency of Communication with Friends and Family:   . Frequency of Social Gatherings with Friends and Family:   . Attends Religious Services:   . Active Member of Clubs or Organizations:   . Attends Archivist Meetings:   Marland Kitchen Marital Status:     Outpatient Encounter  Medications as of 07/06/2019  Medication Sig  . Ascorbic Acid (VITAMIN C) 1000 MG tablet Take 1,000 mg by mouth 2 (two) times a week.  Marland Kitchen atenolol (TENORMIN) 25 MG tablet TAKE 1 TABLET BY MOUTH EVERY DAY  . cholecalciferol (VITAMIN D3) 25 MCG (1000 UT) tablet Take 1,000 Units by mouth daily.  . hydrochlorothiazide (MICROZIDE) 12.5 MG capsule TAKE 1 CAPSULE (12.5 MG TOTAL) BY MOUTH DAILY. FOR BLOOD PRESSURE  . Polyethyl Glycol-Propyl Glycol (SYSTANE) 0.4-0.3 % SOLN Apply to eye as needed.  . rosuvastatin (CRESTOR) 10 MG tablet TAKE 1 TABLET BY MOUTH EVERY DAY  . valsartan (DIOVAN) 320 MG tablet TAKE 1 TABLET BY MOUTH EVERY DAY  . Blood Glucose Monitoring Suppl (ONE TOUCH ULTRA 2) w/Device KIT Use to check blood sugars one time daily. Dx:E11.9 (Patient not taking: Reported on 07/06/2019)  . glucose blood test strip Use to check blood sugars one time daily. Dx: E11.9 (Patient not taking: Reported on 07/06/2019)  . ONE TOUCH LANCETS MISC Use to check blood sugars one time daily. Dx: E11.9 (Patient not taking: Reported on 07/06/2019)   No facility-administered encounter medications on file as of 07/06/2019.    Activities of Daily Living In your present state of health, do you have any difficulty performing the following activities: 07/06/2019  Hearing? N  Vision? N  Difficulty concentrating or making decisions? N  Walking or climbing stairs? Y  Comment needs walker or cane or support  Dressing or bathing? N  Comment uses assistive device  Doing errands, shopping? N  Preparing Food and eating ? N  Using the Toilet? N  In the past six months, have you accidently leaked urine? N  Do you have problems with loss of bowel control? N  Managing your Medications? N  Managing your Finances? N  Housekeeping or managing your Housekeeping? N  Some recent data might be hidden    Patient Care Team: Kerri Chandler, NP as PCP - General (Geriatric Medicine)    Assessment:   This is a routine wellness  examination for Kerri Chan.  Exercise Activities and Dietary  recommendations Current Exercise Habits: Home exercise routine, Type of exercise: walking, Time (Minutes): 20, Frequency (Times/Week): 5, Weekly Exercise (Minutes/Week): 100  Goals    . Weight (lb) < 170 lb (77.1 kg)    . Weight (lb) < 200 lb (90.7 kg)       Fall Risk Fall Risk  07/06/2019 06/09/2019 02/28/2019 11/11/2018 08/18/2018  Falls in the past year? 0 0 0 0 0  Number falls in past yr: 0 0 0 0 0  Injury with Fall? 0 0 0 0 0   Is the patient's home free of loose throw rugs in walkways, pet beds, electrical cords, etc?   yes      Grab bars in the bathroom? yes      Handrails on the stairs?   yes      Adequate lighting?   yes  Timed Get Up and Go performed: na  Depression Screen PHQ 2/9 Scores 07/06/2019 06/09/2019 06/09/2019 02/28/2019  PHQ - 2 Score 0 0 0 0     Cognitive Function MMSE - Mini Mental State Exam 06/29/2017 10/29/2015  Not completed: Unable to complete -  Orientation to time 3 4  Orientation to Place 2 3  Registration 3 3  Attention/ Calculation 0 5  Recall 3 3  Language- name 2 objects 2 2  Language- repeat 1 1  Language- follow 3 step command 3 3  Language- read & follow direction 1 1  Write a sentence 1 1  Copy design 1 1  Total score 20 27     6CIT Screen 07/06/2019 07/01/2018  What Year? 0 points 0 points  What month? 0 points 0 points  What time? 0 points 0 points  Count back from 20 0 points 0 points  Months in reverse 0 points 0 points  Repeat phrase 2 points 4 points  Total Score 2 4    Immunization History  Administered Date(s) Administered  . Influenza, High Dose Seasonal PF 01/25/2018  . Influenza,inj,Quad PF,6+ Mos 03/10/2016  . Influenza-Unspecified 12/22/2013  . Moderna SARS-COVID-2 Vaccination 05/25/2019  . Pneumococcal Conjugate-13 04/02/2017  . Pneumococcal-Unspecified 12/22/2013  . Tdap 03/24/2013    Qualifies for Shingles Vaccine?yes, recommended  Screening Tests Health  Maintenance  Topic Date Due  . FOOT EXAM  Never done  . OPHTHALMOLOGY EXAM  Never done  . HEMOGLOBIN A1C  09/20/2019  . INFLUENZA VACCINE  10/23/2019  . MAMMOGRAM  11/08/2020  . Fecal DNA (Cologuard)  08/04/2021  . TETANUS/TDAP  03/25/2023  . DEXA SCAN  Completed  . PNA vac Low Risk Adult  Completed  . Hepatitis C Screening  Discontinued    Cancer Screenings: Lung: Low Dose CT Chest recommended if Age 15-80 years, 30 pack-year currently smoking OR have quit w/in 15years. Patient does not qualify. Breast:  Up to date on Mammogram? Yes   Up to date of Bone Density/Dexa? Yes Colorectal: up to date  Additional Screenings:  Hepatitis C Screening: na     Plan:      I have personally reviewed and noted the following in the patient's chart:   . Medical and social history . Use of alcohol, tobacco or illicit drugs  . Current medications and supplements . Functional ability and status . Nutritional status . Physical activity . Advanced directives . List of other physicians . Hospitalizations, surgeries, and ER visits in previous 12 months . Vitals . Screenings to include cognitive, depression, and falls . Referrals and appointments  In addition, I have reviewed and  discussed with patient certain preventive protocols, quality metrics, and best practice recommendations. A written personalized care plan for preventive services as well as general preventive health recommendations were provided to patient.     Kerri Chandler, NP  07/06/2019

## 2019-09-09 ENCOUNTER — Other Ambulatory Visit: Payer: Self-pay | Admitting: Nurse Practitioner

## 2019-09-09 DIAGNOSIS — E785 Hyperlipidemia, unspecified: Secondary | ICD-10-CM

## 2019-09-09 DIAGNOSIS — I10 Essential (primary) hypertension: Secondary | ICD-10-CM

## 2019-09-20 ENCOUNTER — Telehealth: Payer: Self-pay

## 2019-09-20 NOTE — Telephone Encounter (Signed)
Spoke with patients son and he agreed to bring patient in office tomorrow

## 2019-09-20 NOTE — Telephone Encounter (Signed)
If able I would like for it to be a in office visit, we will update labs at that time too

## 2019-09-20 NOTE — Telephone Encounter (Addendum)
Patient's son called in reply to a message left on voicemail to confirm appointment for tomorrow.   Kerri Chan prefers for this to be a telephone visit versus in person. I explained to Kerri Chan that its been over 12 months since patient seen [name] in person. Patient seen in office with [name] for knee injections yet the last time she seen [name] whom is her PCP was 01/25/2018.Kerri Chan verbalized understanding, yet would prefer [name]'s input.   Please advise

## 2019-09-21 ENCOUNTER — Other Ambulatory Visit: Payer: Self-pay

## 2019-09-21 ENCOUNTER — Encounter: Payer: Self-pay | Admitting: Nurse Practitioner

## 2019-09-21 ENCOUNTER — Ambulatory Visit (INDEPENDENT_AMBULATORY_CARE_PROVIDER_SITE_OTHER): Payer: Medicare Other | Admitting: Nurse Practitioner

## 2019-09-21 VITALS — BP 132/88 | HR 63 | Temp 96.6°F | Ht 59.0 in | Wt 185.0 lb

## 2019-09-21 DIAGNOSIS — B353 Tinea pedis: Secondary | ICD-10-CM

## 2019-09-21 DIAGNOSIS — E119 Type 2 diabetes mellitus without complications: Secondary | ICD-10-CM | POA: Diagnosis not present

## 2019-09-21 DIAGNOSIS — M170 Bilateral primary osteoarthritis of knee: Secondary | ICD-10-CM | POA: Diagnosis not present

## 2019-09-21 DIAGNOSIS — E785 Hyperlipidemia, unspecified: Secondary | ICD-10-CM

## 2019-09-21 DIAGNOSIS — I10 Essential (primary) hypertension: Secondary | ICD-10-CM

## 2019-09-21 DIAGNOSIS — Z6837 Body mass index (BMI) 37.0-37.9, adult: Secondary | ICD-10-CM

## 2019-09-21 MED ORDER — TERBINAFINE HCL 1 % EX CREA: 1.0000 "application " | TOPICAL_CREAM | Freq: Two times a day (BID) | CUTANEOUS | 2 refills | Status: AC

## 2019-09-21 NOTE — Progress Notes (Signed)
Careteam: Patient Care Team: Lauree Chandler, NP as PCP - General (Geriatric Medicine)  PLACE OF SERVICE:  [place] Directive information Does Patient Have a Medical Advance Directive?: No, Would patient like information on creating a medical advance directive?: Yes (MAU/Ambulatory/Procedural Areas - Information given)  No Known Allergies  Chief Complaint  Patient presents with   Medical Management of Chronic Issues    6 month follow-up. Patient will move 11/16/2019 to [facility]    Quality Metric Gaps    Discuss need for eye exam. Foot exam due today      HPI: Patient is a 74 y.o. female for routine follow up.   Using walker or cane- has cane today.  OA knees- doing well. Last injection in march.    DM- diet controlled. Blood sugars "good" per son.  Got new glasses a few years ago.   Walking more now. Soaking feet in epsom salt and warm water.   HTN- controlled on valsaran and valsartan  Plans to move in august.      Review of Systems:  Review of Systems  Constitutional: Negative for chills, fever and weight loss.  HENT: Negative for tinnitus.   Respiratory: Negative for cough, sputum production and shortness of breath.   Cardiovascular: Negative for chest pain, palpitations and leg swelling.  Gastrointestinal: Negative for abdominal pain, constipation, diarrhea and heartburn.  Genitourinary: Negative for dysuria, frequency and urgency.  Musculoskeletal: Negative for back pain, falls, joint pain and myalgias.  Skin: Negative.   Neurological: Negative for dizziness and headaches.  Psychiatric/Behavioral: Negative for depression and memory loss. The patient does not have insomnia.     Past Medical History:  Diagnosis Date   Arthritis    High blood pressure    Past Surgical History:  Procedure Laterality Date   ABDOMINAL HYSTERECTOMY  2013   due to cancer in uterus    CATARACT EXTRACTION W/ INTRAOCULAR LENS  IMPLANT, BILATERAL  2014    PARTIAL NEPHRECTOMY  2014   Right upper, mass   Social History:   reports that she has never smoked. She has never used smokeless tobacco. She reports that she does not drink alcohol and does not use drugs.  Family History  Problem Relation Age of Onset   Arthritis Mother    Arthritis Father     Medications: Patient's Medications  New Prescriptions   No medications on file  Previous Medications   ASCORBIC ACID (VITAMIN C) 1000 MG TABLET    Take 1,000 mg by mouth 2 (two) times a week.   ATENOLOL (TENORMIN) 25 MG TABLET    TAKE 1 TABLET BY MOUTH EVERY DAY   BLOOD GLUCOSE MONITORING SUPPL (ONE TOUCH ULTRA 2) W/DEVICE KIT    Use to check blood sugars one time daily. Dx:E11.9   CHOLECALCIFEROL (VITAMIN D3) 25 MCG (1000 UT) TABLET    Take 1,000 Units by mouth daily.   GLUCOSE BLOOD TEST STRIP    Use to check blood sugars one time daily. Dx: E11.9   HYDROCHLOROTHIAZIDE (MICROZIDE) 12.5 MG CAPSULE    TAKE 1 CAPSULE (12.5 MG TOTAL) BY MOUTH DAILY. FOR BLOOD PRESSURE   ONE TOUCH LANCETS MISC    Use to check blood sugars one time daily. Dx: E11.9   POLYETHYL GLYCOL-PROPYL GLYCOL (SYSTANE) 0.4-0.3 % SOLN    Apply to eye as needed.   ROSUVASTATIN (CRESTOR) 10 MG TABLET    TAKE 1 TABLET BY MOUTH EVERY DAY   VALSARTAN (DIOVAN) 320 MG TABLET  TAKE 1 TABLET BY MOUTH EVERY DAY  Modified Medications   No medications on file  Discontinued Medications   No medications on file    Physical Exam:  Vitals:   09/21/19 0844  BP: 132/88  Pulse: 63  Temp: (!) 96.6 F (35.9 C)  TempSrc: Temporal  SpO2: 95%  Weight: 185 lb (83.9 kg)  Height: _0  (1.499 m)   Body mass index is 37.37 kg/m. Wt Readings from Last 3 Encounters:  09/21/19 185 lb (83.9 kg)  06/09/19 183 lb (83 kg)  11/11/18 182 lb (82.6 kg)    Physical Exam Constitutional:      General: She is not in acute distress.    Appearance: She is well-developed. She is not diaphoretic.  HENT:     Head: Normocephalic and  atraumatic.     Mouth/Throat:     Pharynx: No oropharyngeal exudate.  Eyes:     Conjunctiva/sclera: Conjunctivae normal.     Pupils: Pupils are equal, round, and reactive to light.  Cardiovascular:     Rate and Rhythm: Normal rate and regular rhythm.     Pulses:          Dorsalis pedis pulses are 1+ on the right side and 1+ on the left side.       Posterior tibial pulses are 1+ on the right side and 1+ on the left side.     Heart sounds: Normal heart sounds.  Pulmonary:     Effort: Pulmonary effort is normal.     Breath sounds: Normal breath sounds.  Abdominal:     General: Bowel sounds are normal.     Palpations: Abdomen is soft.  Musculoskeletal:        General: No tenderness.     Cervical back: Normal range of motion and neck supple.  Feet:     Right foot:     Protective Sensation: 3 sites tested. 3 sites sensed.     Skin integrity: Skin integrity normal.     Toenail Condition: Fungal disease present.    Left foot:     Protective Sensation: 3 sites tested. 3 sites sensed.     Skin integrity: Skin integrity normal.     Toenail Condition: Fungal disease present.    Comments: Fungal disease noted between 4th and 5th toe bilaterally  Skin:    General: Skin is warm and dry.  Neurological:     Mental Status: She is alert and oriented to person, place, and time.     Labs reviewed: Basic Metabolic Panel: Recent Labs    10/06/18 0847 03/22/19 0837  NA 139 140  K 4.3 3.9  CL 102 99  CO2 33* 32  GLUCOSE 125* 117*  BUN 19 18  CREATININE 0.81 0.75  CALCIUM 9.9 10.0   Liver Function Tests: Recent Labs    03/22/19 0837  AST 16  ALT 11  BILITOT 0.5  PROT 7.1   No results for input(s): LIPASE, AMYLASE in the last 8760 hours. No results for input(s): AMMONIA in the last 8760 hours. CBC: Recent Labs    03/22/19 0837  WBC 5.8  NEUTROABS 2,917  HGB 13.6  HCT 41.8  MCV 92.9  PLT 229   Lipid Panel: Recent Labs    03/22/19 0837  CHOL 217*  HDL 58  LDLCALC  143*  TRIG 65  CHOLHDL 3.7   TSH: No results for input(s): TSH in the last 8760 hours. A1C: Lab Results  Component Value Date   HGBA1C 6.7 (  H) 03/22/2019     Assessment/Plan 1. Class 2 severe obesity due to excess calories with serious comorbidity and body mass index (BMI) of 37.0 to 37.9 in adult (HCC) Unchanged, again discussed dietary changes, limited exercise due to bilateral knee pained but discussed chair exercises  2. Type 2 diabetes mellitus without complication, without long-term current use of insulin (HCC) -diet controlled.  Encouraged dietary compliance, routine foot care/monitoring and to keep up with diabetic eye exams through ophthalmology-- needs to make appt - Hemoglobin A1c  3. Hyperlipidemia, unspecified hyperlipidemia type -LDL not at goal, now on crestor 10 mg daily, without worsening myalgias, will follow up.  - CMP with eGFR([facility]) - Lipid Panel  4. Primary osteoarthritis of both knees -stable, continues with ice PRN, braces and injections per Dr Mariea Clonts.  - CBC with Differential/Platelet  5. Essential hypertension -controlled on atenolol and valsartan  - CMP with eGFR([facility]) - CBC with Differential/Platelet  6. Tinea pedis of both feet Clean feet bilaterally and dry thoroughly  - terbinafine (LAMISIL AT) 1 % cream; Apply 1 application topically 2 (two) times daily.  Dispense: 30 g; Refill: 2   Next appt: 11/03/2019 with Dr Mariea Clonts.  Carlos American. [place] [phone]

## 2019-09-21 NOTE — Patient Instructions (Signed)
To wash and dry feet thoroughly, apply cream twice daily, dry with hairdryer on low   To make appt with Dr Renato Gails Aug for knee injections

## 2019-09-22 LAB — LIPID PANEL
Cholesterol: 198 mg/dL (ref <200)
HDL: 57 mg/dL (ref >=50)
LDL Cholesterol (Calc): 124 mg/dL (calc) — ABNORMAL HIGH
Non-HDL Cholesterol (Calc): 141 mg/dL (calc) — ABNORMAL HIGH (ref <130)
Total CHOL/HDL Ratio: 3.5 (calc) (ref <5.0)
Triglycerides: 76 mg/dL (ref <150)

## 2019-09-22 LAB — CBC WITH DIFFERENTIAL/PLATELET
Absolute Monocytes: 543 cells/uL (ref 200–950)
Basophils Absolute: 53 cells/uL (ref 0–200)
Basophils Relative: 0.9 %
Eosinophils Absolute: 189 cells/uL (ref 15–500)
Eosinophils Relative: 3.2 %
HCT: 38.3 % (ref 35.0–45.0)
Hemoglobin: 12.7 g/dL (ref 11.7–15.5)
Lymphs Abs: 2201 cells/uL (ref 850–3900)
MCH: 30.8 pg (ref 27.0–33.0)
MCHC: 33.2 g/dL (ref 32.0–36.0)
MCV: 92.7 fL (ref 80.0–100.0)
MPV: 10.9 fL (ref 7.5–12.5)
Monocytes Relative: 9.2 %
Neutro Abs: 2915 cells/uL (ref 1500–7800)
Neutrophils Relative %: 49.4 %
Platelets: 218 10*3/uL (ref 140–400)
RBC: 4.13 10*6/uL (ref 3.80–5.10)
RDW: 11.7 % (ref 11.0–15.0)
Total Lymphocyte: 37.3 %
WBC: 5.9 10*3/uL (ref 3.8–10.8)

## 2019-09-22 LAB — COMPLETE METABOLIC PANEL WITH GFR
AG Ratio: 1.4 (calc) (ref 1.0–2.5)
ALT: 12 U/L (ref 6–29)
AST: 16 U/L (ref 10–35)
Albumin: 4.2 g/dL (ref 3.6–5.1)
Alkaline phosphatase (APISO): 50 U/L (ref 37–153)
BUN: 19 mg/dL (ref 7–25)
CO2: 33 mmol/L — ABNORMAL HIGH (ref 20–32)
Calcium: 10.2 mg/dL (ref 8.6–10.4)
Chloride: 99 mmol/L (ref 98–110)
Creat: 0.79 mg/dL (ref 0.60–0.93)
GFR, Est African American: 85 mL/min/{1.73_m2} (ref >=60)
GFR, Est Non African American: 74 mL/min/{1.73_m2} (ref >=60)
Globulin: 2.9 g/dL (calc) (ref 1.9–3.7)
Glucose, Bld: 127 mg/dL — ABNORMAL HIGH (ref 65–99)
Potassium: 3.7 mmol/L (ref 3.5–5.3)
Sodium: 139 mmol/L (ref 135–146)
Total Bilirubin: 0.7 mg/dL (ref 0.2–1.2)
Total Protein: 7.1 g/dL (ref 6.1–8.1)

## 2019-09-22 LAB — HEMOGLOBIN A1C
Hgb A1c MFr Bld: 6.8 % of total Hgb — ABNORMAL HIGH (ref <5.7)
Mean Plasma Glucose: 148 (calc)
eAG (mmol/L): 8.2 (calc)

## 2019-09-22 MED ORDER — ROSUVASTATIN CALCIUM 20 MG PO TABS: 10.0000 mg | ORAL_TABLET | Freq: Every day | ORAL | 1 refills | Status: DC

## 2019-09-22 NOTE — Addendum Note (Signed)
Addended by: [name] on: 09/22/2019 11:01 AM   Modules accepted: Orders

## 2019-09-23 ENCOUNTER — Other Ambulatory Visit: Payer: Self-pay | Admitting: Nurse Practitioner

## 2019-11-03 ENCOUNTER — Encounter: Payer: Self-pay | Admitting: Internal Medicine

## 2019-11-03 ENCOUNTER — Other Ambulatory Visit: Payer: Self-pay

## 2019-11-03 ENCOUNTER — Ambulatory Visit (INDEPENDENT_AMBULATORY_CARE_PROVIDER_SITE_OTHER): Payer: Medicare Other | Admitting: Internal Medicine

## 2019-11-03 VITALS — BP 138/72 | HR 66 | Temp 98.7°F | Ht 59.0 in | Wt 182.4 lb

## 2019-11-03 DIAGNOSIS — M170 Bilateral primary osteoarthritis of knee: Secondary | ICD-10-CM

## 2019-11-03 DIAGNOSIS — I10 Essential (primary) hypertension: Secondary | ICD-10-CM | POA: Diagnosis not present

## 2019-11-03 DIAGNOSIS — E785 Hyperlipidemia, unspecified: Secondary | ICD-10-CM

## 2019-11-03 MED ORDER — HYDROCHLOROTHIAZIDE 12.5 MG PO CAPS: 12.5000 mg | ORAL_CAPSULE | Freq: Every day | ORAL | 1 refills | Status: DC

## 2019-11-03 MED ORDER — METHYLPREDNISOLONE ACETATE 40 MG/ML IJ SUSP: 40.0000 mg | Freq: Once | INTRAMUSCULAR | Status: AC

## 2019-11-03 MED ORDER — ROSUVASTATIN CALCIUM 20 MG PO TABS: 10.0000 mg | ORAL_TABLET | Freq: Every day | ORAL | 1 refills | Status: DC

## 2019-11-03 MED ORDER — VALSARTAN 320 MG PO TABS: 320.0000 mg | ORAL_TABLET | Freq: Every day | ORAL | 0 refills | Status: DC

## 2019-11-03 NOTE — Progress Notes (Signed)
Location:  [facility] Provider: Gracynn Rajewski L. Mariea Clonts, D.O., C.M.D.  Goals of Care:  Advanced Directives 11/03/2019  Does Patient Have a Medical Advance Directive? No  Would patient like information on creating a medical advance directive? No - Patient declined     Chief Complaint  Patient presents with  . Medical Management of Chronic Issues     Bilateral knee injection     HPI: Patient is a 74 y.o. female seen today for an acute visit for bilateral knee injections for osteoarthritis with pain unrelieved with otc medications and topicals.  She continues to walk at lunchtime and through the home.  They really are moving to [place] next week and wanted to get her knees taken care of again first so she can get around.    Past Medical History:  Diagnosis Date  . Arthritis   . High blood pressure     Past Surgical History:  Procedure Laterality Date  . ABDOMINAL HYSTERECTOMY  2013   due to cancer in uterus   . CATARACT EXTRACTION W/ INTRAOCULAR LENS  IMPLANT, BILATERAL  2014  . PARTIAL NEPHRECTOMY  2014   Right upper, mass    No Known Allergies  Outpatient Encounter Medications as of 11/03/2019  Medication Sig  . Ascorbic Acid (VITAMIN C) 1000 MG tablet Take 1,000 mg by mouth 2 (two) times a week.  Marland Kitchen atenolol (TENORMIN) 25 MG tablet TAKE 1 TABLET BY MOUTH EVERY DAY  . Blood Glucose Monitoring Suppl (ONE TOUCH ULTRA 2) w/Device KIT Use to check blood sugars one time daily. Dx:E11.9  . cholecalciferol (VITAMIN D3) 25 MCG (1000 UT) tablet Take 1,000 Units by mouth daily.  Marland Kitchen glucose blood test strip Use to check blood sugars one time daily. Dx: E11.9  . hydrochlorothiazide (MICROZIDE) 12.5 MG capsule Take 1 capsule (12.5 mg total) by mouth daily. for blood pressure  . ONE TOUCH LANCETS MISC Use to check blood sugars one time daily. Dx: E11.9  . Polyethyl Glycol-Propyl Glycol (SYSTANE) 0.4-0.3 % SOLN Apply to eye as needed.  . rosuvastatin (CRESTOR) 20 MG tablet Take 0.5 tablets (10  mg total) by mouth daily.  Marland Kitchen terbinafine (LAMISIL AT) 1 % cream Apply 1 application topically 2 (two) times daily.  . valsartan (DIOVAN) 320 MG tablet Take 1 tablet (320 mg total) by mouth daily.  . [DISCONTINUED] hydrochlorothiazide (MICROZIDE) 12.5 MG capsule TAKE 1 CAPSULE (12.5 MG TOTAL) BY MOUTH DAILY. FOR BLOOD PRESSURE  . [DISCONTINUED] rosuvastatin (CRESTOR) 20 MG tablet Take 0.5 tablets (10 mg total) by mouth daily.  . [DISCONTINUED] valsartan (DIOVAN) 320 MG tablet TAKE 1 TABLET BY MOUTH EVERY DAY   Facility-Administered Encounter Medications as of 11/03/2019  Medication  . methylPREDNISolone acetate (DEPO-MEDROL) injection 40 mg    Review of Systems:  Review of Systems  Musculoskeletal: Positive for joint pain. Negative for falls.    Health Maintenance  Topic Date Due  . OPHTHALMOLOGY EXAM  Never done  . INFLUENZA VACCINE  10/23/2019  . HEMOGLOBIN A1C  03/22/2020  . FOOT EXAM  09/20/2020  . MAMMOGRAM  11/08/2020  . Fecal DNA (Cologuard)  08/04/2021  . TETANUS/TDAP  03/25/2023  . DEXA SCAN  Completed  . COVID-19 Vaccine  Completed  . PNA vac Low Risk Adult  Completed  . Hepatitis C Screening  Discontinued    Physical Exam: Vitals:   11/03/19 1024  BP: 138/72  Pulse: 66  Temp: 98.7 F (37.1 C)  TempSrc: Temporal  SpO2: 96%  Weight: 182 lb  6.4 oz (82.7 kg)  Height: 4' 11"  (1.499 m)   Body mass index is 36.84 kg/m. Physical Exam Vitals reviewed.  Constitutional:      Appearance: Normal appearance. She is obese.  Musculoskeletal:        General: Normal range of motion.     Right lower leg: No edema.     Left lower leg: No edema.     Comments: Ticklish when knees touched; prays before each injection  Neurological:     Mental Status: She is alert. Mental status is at baseline.  Psychiatric:        Mood and Affect: Mood normal.        Behavior: Behavior normal.     Labs reviewed: Basic Metabolic Panel: Recent Labs    03/22/19 0837 09/21/19 0915   NA 140 139  K 3.9 3.7  CL 99 99  CO2 32 33*  GLUCOSE 117* 127*  BUN 18 19  CREATININE 0.75 0.79  CALCIUM 10.0 10.2   Liver Function Tests: Recent Labs    03/22/19 0837 09/21/19 0915  AST 16 16  ALT 11 12  BILITOT 0.5 0.7  PROT 7.1 7.1   No results for input(s): LIPASE, AMYLASE in the last 8760 hours. No results for input(s): AMMONIA in the last 8760 hours. CBC: Recent Labs    03/22/19 0837 09/21/19 0915  WBC 5.8 5.9  NEUTROABS 2,917 2,915  HGB 13.6 12.7  HCT 41.8 38.3  MCV 92.9 92.7  PLT 229 218   Lipid Panel: Recent Labs    03/22/19 0837 09/21/19 0915  CHOL 217* 198  HDL 58 57  LDLCALC 143* 124*  TRIG 65 76  CHOLHDL 3.7 3.5   Lab Results  Component Value Date   HGBA1C 6.8 (H) 09/21/2019    Procedures since last visit: No results found.  Assessment/Plan 1.  Primary OA of both knees - bilateral knees cleansed with betadine medially x 2, sprayed with topical bupivicaine spray, then injected with depomedrol 550m (145m and 1% lidocaine 50m63mimmediate relief noted  2. Essential hypertension - renewed hydrochlorothiazide (MICROZIDE) 12.5 MG capsule; Take 1 capsule (12.5 mg total) by mouth daily. for blood pressure  Dispense: 90 capsule; Refill: 1  3. Hyperlipidemia, unspecified hyperlipidemia type - renewed rosuvastatin (CRESTOR) 20 MG tablet; Take 0.5 tablets (10 mg total) by mouth daily.  Dispense: 90 tablet; Refill: 1  [place]  Labs/tests ordered:  No new Next appt:  [place], D.O. [place] [address] [place] [id number] Phone (Mon-Fri 8am-5pm):  [phone] Call:  [phone] prompts after 5pm & weekends Office Phone:  [phone] Fax:  [phone]

## 2019-11-22 ENCOUNTER — Other Ambulatory Visit: Payer: Self-pay | Admitting: *Deleted

## 2019-11-22 ENCOUNTER — Other Ambulatory Visit: Payer: Self-pay | Admitting: Nurse Practitioner

## 2019-11-22 DIAGNOSIS — I10 Essential (primary) hypertension: Secondary | ICD-10-CM

## 2019-11-22 DIAGNOSIS — E785 Hyperlipidemia, unspecified: Secondary | ICD-10-CM

## 2019-11-22 MED ORDER — ATENOLOL 25 MG PO TABS: 25.0000 mg | ORAL_TABLET | Freq: Every day | ORAL | 0 refills | Status: DC

## 2019-11-22 MED ORDER — HYDROCHLOROTHIAZIDE 12.5 MG PO CAPS: 12.5000 mg | ORAL_CAPSULE | Freq: Every day | ORAL | 0 refills | Status: DC

## 2019-11-22 MED ORDER — ROSUVASTATIN CALCIUM 20 MG PO TABS: 10.0000 mg | ORAL_TABLET | Freq: Every day | ORAL | 0 refills | Status: AC

## 2019-11-22 MED ORDER — VALSARTAN 320 MG PO TABS: 320.0000 mg | ORAL_TABLET | Freq: Every day | ORAL | 0 refills | Status: DC

## 2019-11-22 NOTE — Telephone Encounter (Signed)
Requested medication was not on active medication list

## 2019-11-22 NOTE — Telephone Encounter (Signed)
Patient son called and stated that they have relocated in [place]. Stated that the local pharmacy there has to have the Rx's in order to refill medications.  They are looking for a new PCP.  Pended Rx's and sent to [facility] for approval.

## 2019-11-25 ENCOUNTER — Other Ambulatory Visit: Payer: Self-pay | Admitting: Nurse Practitioner

## 2019-11-25 DIAGNOSIS — I10 Essential (primary) hypertension: Secondary | ICD-10-CM

## 2019-11-25 NOTE — Telephone Encounter (Signed)
Requested medication not on medication list  

## 2020-03-10 ENCOUNTER — Other Ambulatory Visit: Payer: Self-pay | Admitting: Nurse Practitioner

## 2020-03-10 DIAGNOSIS — I10 Essential (primary) hypertension: Secondary | ICD-10-CM

## 2020-03-14 ENCOUNTER — Other Ambulatory Visit: Payer: Self-pay

## 2020-03-14 DIAGNOSIS — I10 Essential (primary) hypertension: Secondary | ICD-10-CM

## 2020-03-14 MED ORDER — HYDROCHLOROTHIAZIDE 12.5 MG PO CAPS: 12.5000 mg | ORAL_CAPSULE | Freq: Every day | ORAL | 0 refills | Status: AC

## 2020-03-14 MED ORDER — ATENOLOL 25 MG PO TABS: 25.0000 mg | ORAL_TABLET | Freq: Every day | ORAL | 0 refills | Status: AC

## 2020-03-14 MED ORDER — VALSARTAN 320 MG PO TABS: 320.0000 mg | ORAL_TABLET | Freq: Every day | ORAL | 0 refills | Status: AC

## 2021-07-21 IMAGING — MG DIGITAL SCREENING BILATERAL MAMMOGRAM WITH TOMO AND CAD
8 of 14 series · 8 of 40 positions shown · non-contrast
Comparison: Previous exam(s).

CLINICAL DATA: Screening.

EXAM:
DIGITAL SCREENING BILATERAL MAMMOGRAM WITH TOMO AND CAD

[R CC synth-2D (1 of 2)]
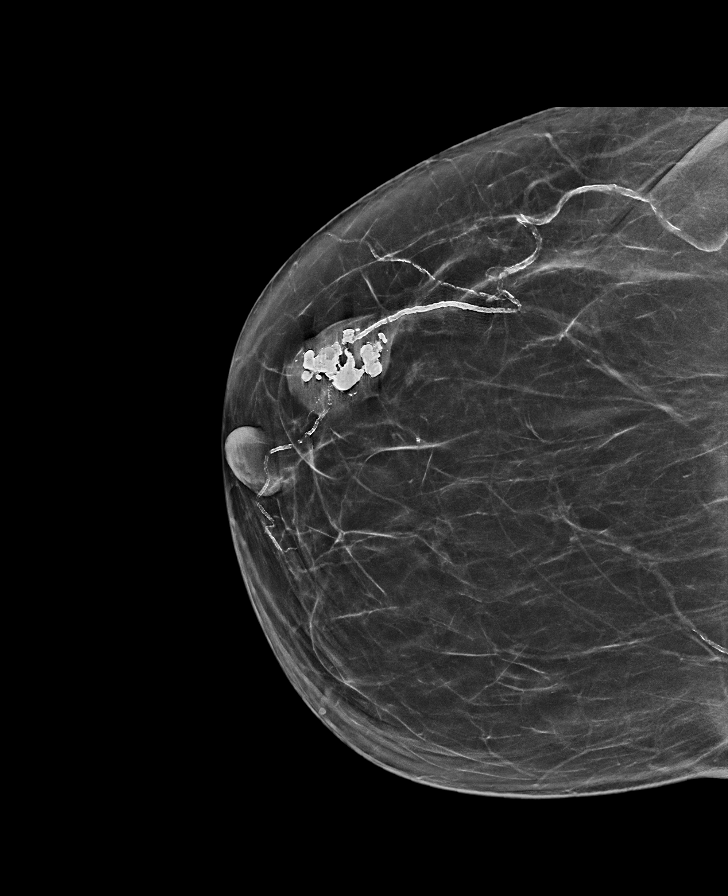

[L MLO synth-2D (1 of 2)]
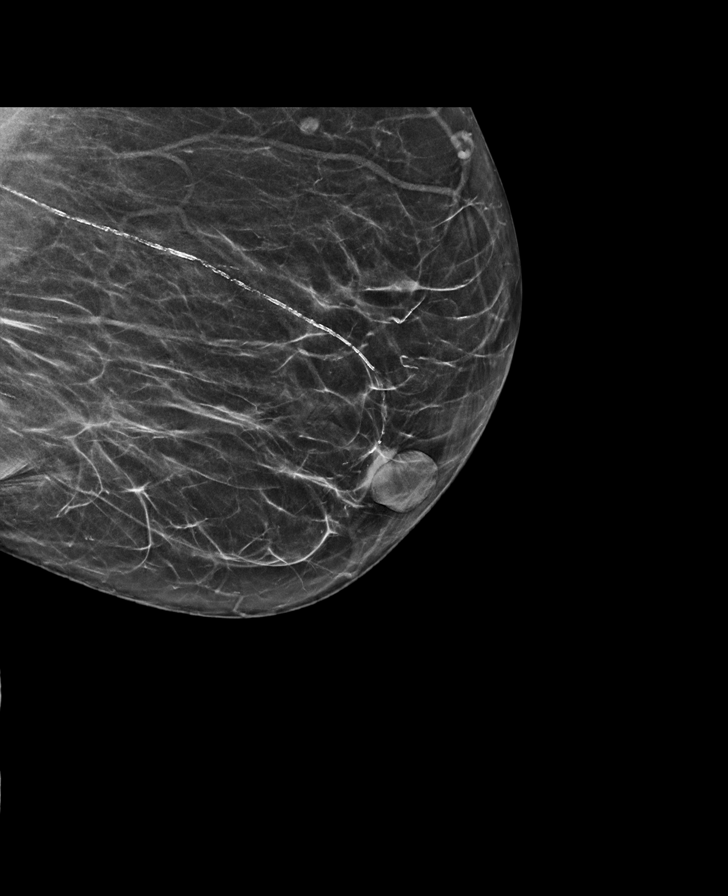

[L CC synth-2D (1 of 2)]
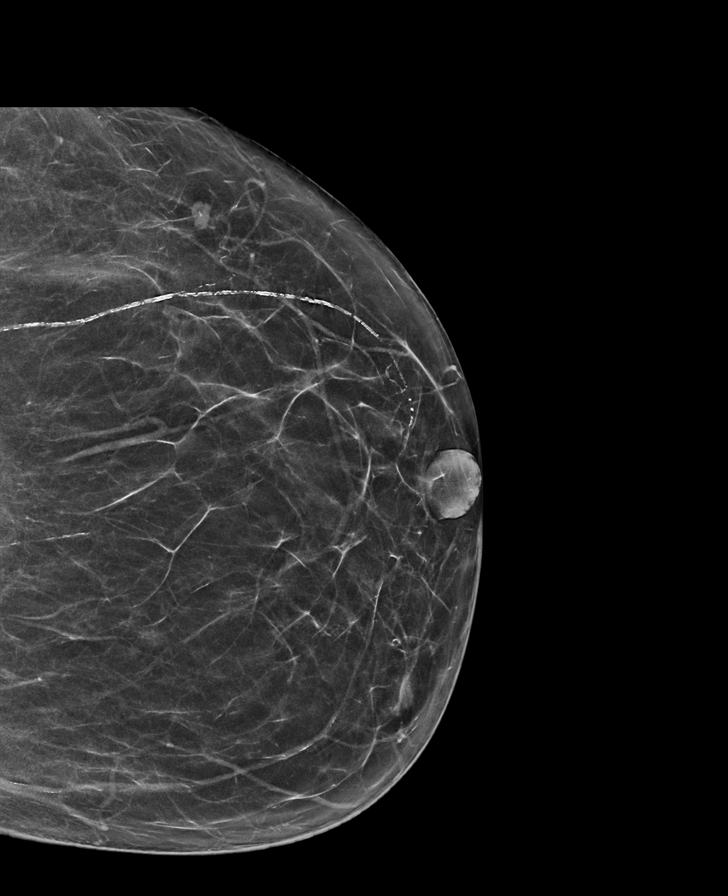

[L MLO synth-2D (2 of 2)]
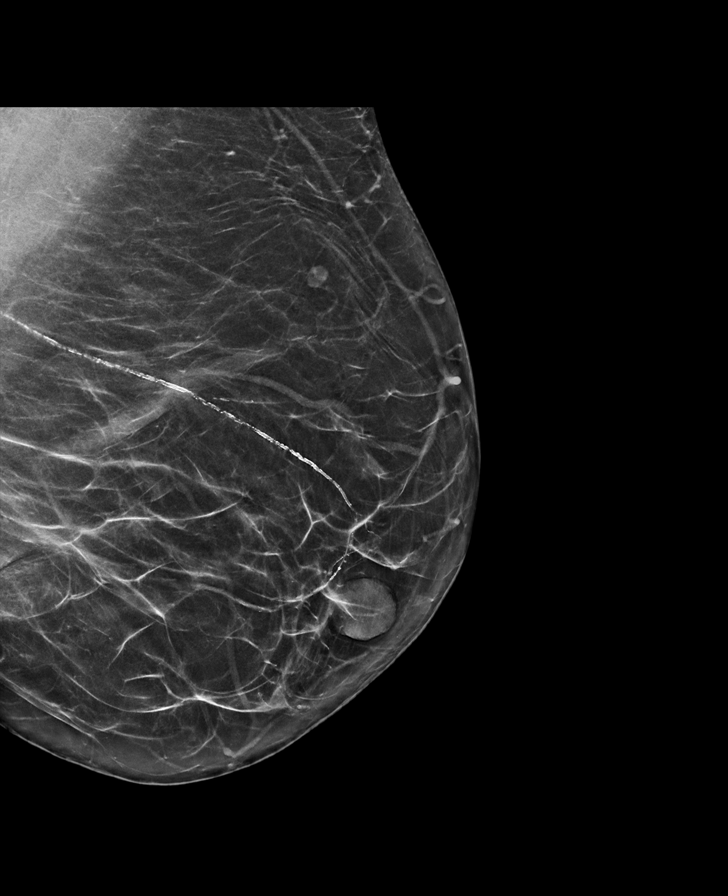

[R MLO synth-2D]
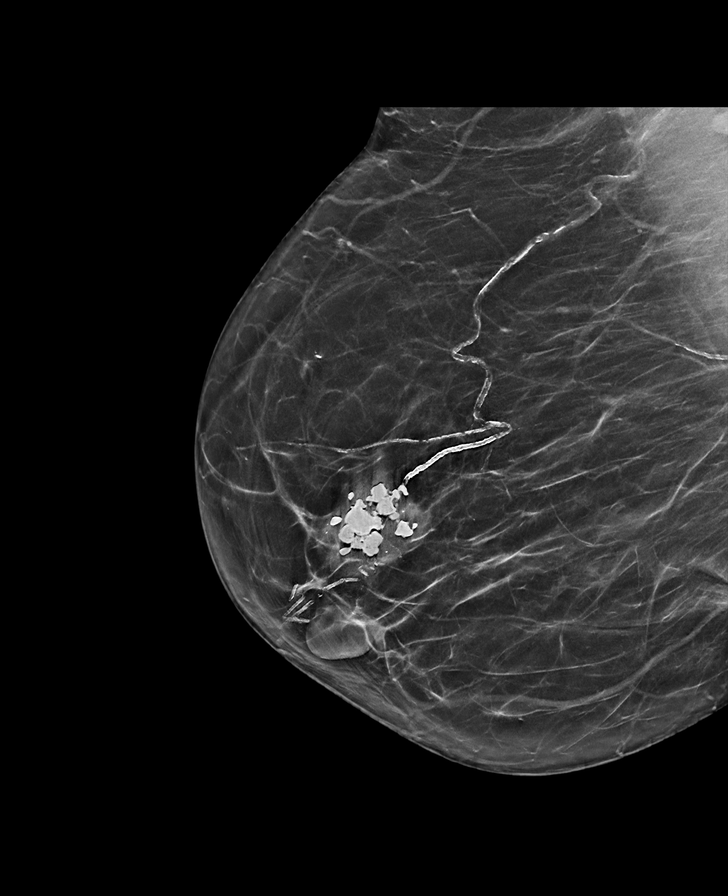

[R CC synth-2D (2 of 2)]
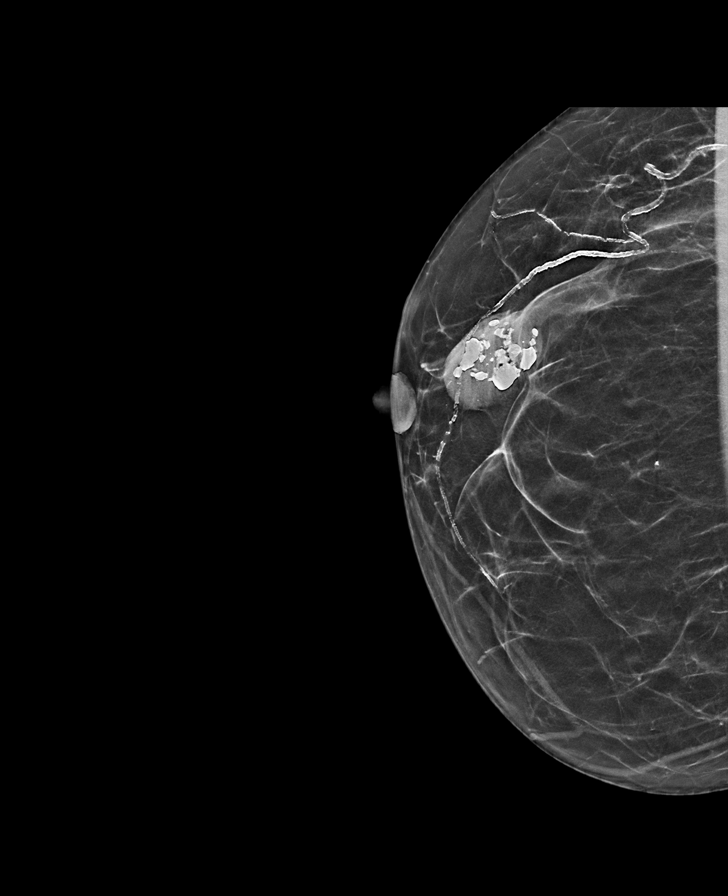

[L CC synth-2D (2 of 2)]
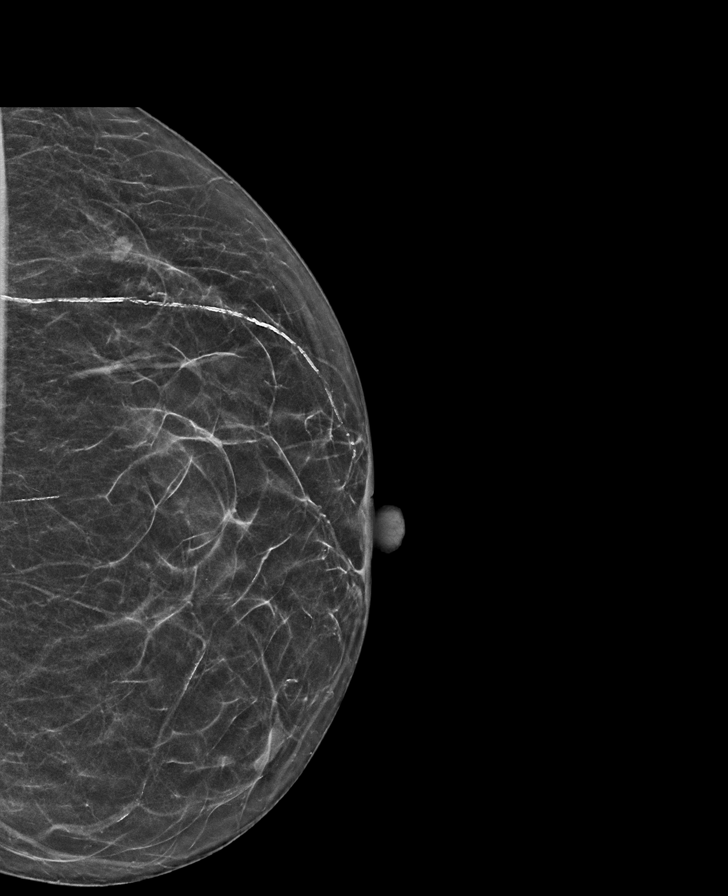

[L MLO tomo · tomo slice 35/69.0]
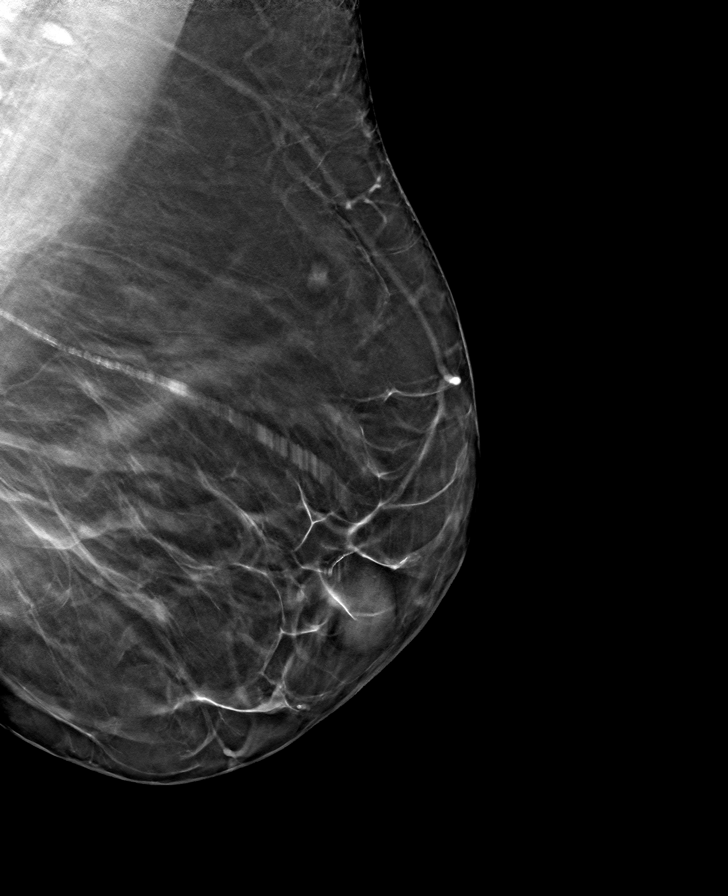

[8 of 40 positions shown; findings below may reference images not displayed]

ACR Breast Density Category b: There are scattered areas of
fibroglandular density.
FINDINGS: There are no findings suspicious for malignancy. Images were
processed with CAD.
IMPRESSION: No mammographic evidence of malignancy. A result letter of this
screening mammogram will be mailed directly to the patient.

RECOMMENDATION:
Screening mammogram in one year. (Code:[id number])

BI-RADS CATEGORY  1: Negative.
